# Patient Record
Sex: Male | Born: 1970 | Race: Black or African American | Hispanic: No | Marital: Single | Smoking: Current every day smoker
Health system: Southern US, Community
[De-identification: ages and names within clinical notes are randomized; demographics above are authoritative.]

## PROBLEM LIST (undated history)

## (undated) DIAGNOSIS — I10 Essential (primary) hypertension: Secondary | ICD-10-CM

## (undated) DIAGNOSIS — R200 Anesthesia of skin: Secondary | ICD-10-CM

## (undated) DIAGNOSIS — R21 Rash and other nonspecific skin eruption: Secondary | ICD-10-CM

## (undated) DIAGNOSIS — K219 Gastro-esophageal reflux disease without esophagitis: Secondary | ICD-10-CM

## (undated) DIAGNOSIS — G43909 Migraine, unspecified, not intractable, without status migrainosus: Secondary | ICD-10-CM

## (undated) DIAGNOSIS — F419 Anxiety disorder, unspecified: Secondary | ICD-10-CM

## (undated) DIAGNOSIS — F411 Generalized anxiety disorder: Secondary | ICD-10-CM

## (undated) HISTORY — DX: Essential (primary) hypertension: I10

## (undated) HISTORY — DX: Anesthesia of skin: R20.0

## (undated) HISTORY — DX: Gastro-esophageal reflux disease without esophagitis: K21.9

## (undated) HISTORY — DX: Anxiety disorder, unspecified: F41.9

## (undated) HISTORY — DX: Rash and other nonspecific skin eruption: R21

## (undated) HISTORY — DX: Generalized anxiety disorder: F41.1

## (undated) HISTORY — DX: Migraine, unspecified, not intractable, without status migrainosus: G43.909

## (undated) HISTORY — PX: APPENDECTOMY: SHX54

---

## 1997-11-23 ENCOUNTER — Emergency Department (HOSPITAL_COMMUNITY): Admission: EM | Admit: 1997-11-23 | Discharge: 1997-11-24 | Payer: Self-pay | Admitting: Emergency Medicine

## 2000-02-10 ENCOUNTER — Emergency Department (HOSPITAL_COMMUNITY): Admission: EM | Admit: 2000-02-10 | Discharge: 2000-02-10 | Payer: Self-pay | Admitting: *Deleted

## 2000-03-01 ENCOUNTER — Encounter: Payer: Self-pay | Admitting: Emergency Medicine

## 2000-03-01 ENCOUNTER — Emergency Department (HOSPITAL_COMMUNITY): Admission: EM | Admit: 2000-03-01 | Discharge: 2000-03-01 | Payer: Self-pay | Admitting: Emergency Medicine

## 2000-07-11 ENCOUNTER — Emergency Department (HOSPITAL_COMMUNITY): Admission: EM | Admit: 2000-07-11 | Discharge: 2000-07-11 | Payer: Self-pay | Admitting: Emergency Medicine

## 2000-08-28 ENCOUNTER — Emergency Department (HOSPITAL_COMMUNITY): Admission: EM | Admit: 2000-08-28 | Discharge: 2000-08-29 | Payer: Self-pay | Admitting: Emergency Medicine

## 2001-06-08 ENCOUNTER — Emergency Department (HOSPITAL_COMMUNITY): Admission: EM | Admit: 2001-06-08 | Discharge: 2001-06-08 | Payer: Self-pay | Admitting: Emergency Medicine

## 2001-06-08 ENCOUNTER — Encounter: Payer: Self-pay | Admitting: Emergency Medicine

## 2001-12-29 ENCOUNTER — Encounter: Payer: Self-pay | Admitting: Emergency Medicine

## 2001-12-29 ENCOUNTER — Emergency Department (HOSPITAL_COMMUNITY): Admission: EM | Admit: 2001-12-29 | Discharge: 2001-12-29 | Payer: Self-pay | Admitting: Emergency Medicine

## 2002-06-19 DIAGNOSIS — R200 Anesthesia of skin: Secondary | ICD-10-CM

## 2002-06-19 HISTORY — DX: Anesthesia of skin: R20.0

## 2002-06-23 ENCOUNTER — Encounter: Payer: Self-pay | Admitting: Emergency Medicine

## 2002-06-23 ENCOUNTER — Emergency Department (HOSPITAL_COMMUNITY): Admission: EM | Admit: 2002-06-23 | Discharge: 2002-06-23 | Payer: Self-pay | Admitting: Emergency Medicine

## 2002-07-19 ENCOUNTER — Encounter: Payer: Self-pay | Admitting: *Deleted

## 2002-07-19 ENCOUNTER — Emergency Department (HOSPITAL_COMMUNITY): Admission: EM | Admit: 2002-07-19 | Discharge: 2002-07-19 | Payer: Self-pay | Admitting: *Deleted

## 2002-07-21 ENCOUNTER — Observation Stay (HOSPITAL_COMMUNITY): Admission: EM | Admit: 2002-07-21 | Discharge: 2002-07-23 | Payer: Self-pay | Admitting: Emergency Medicine

## 2002-07-21 ENCOUNTER — Encounter: Payer: Self-pay | Admitting: Internal Medicine

## 2002-07-22 ENCOUNTER — Encounter: Payer: Self-pay | Admitting: Pediatrics

## 2002-07-22 ENCOUNTER — Encounter (INDEPENDENT_AMBULATORY_CARE_PROVIDER_SITE_OTHER): Payer: Self-pay | Admitting: *Deleted

## 2003-01-21 ENCOUNTER — Emergency Department (HOSPITAL_COMMUNITY): Admission: EM | Admit: 2003-01-21 | Discharge: 2003-01-22 | Payer: Self-pay | Admitting: Emergency Medicine

## 2003-01-22 ENCOUNTER — Encounter: Payer: Self-pay | Admitting: Emergency Medicine

## 2003-03-29 ENCOUNTER — Emergency Department (HOSPITAL_COMMUNITY): Admission: EM | Admit: 2003-03-29 | Discharge: 2003-03-29 | Payer: Self-pay | Admitting: Emergency Medicine

## 2003-05-08 ENCOUNTER — Emergency Department (HOSPITAL_COMMUNITY): Admission: EM | Admit: 2003-05-08 | Discharge: 2003-05-08 | Payer: Self-pay | Admitting: Emergency Medicine

## 2003-07-06 ENCOUNTER — Emergency Department (HOSPITAL_COMMUNITY): Admission: AD | Admit: 2003-07-06 | Discharge: 2003-07-06 | Payer: Self-pay | Admitting: Family Medicine

## 2003-07-27 ENCOUNTER — Emergency Department (HOSPITAL_COMMUNITY): Admission: EM | Admit: 2003-07-27 | Discharge: 2003-07-27 | Payer: Self-pay | Admitting: Family Medicine

## 2003-08-12 ENCOUNTER — Emergency Department (HOSPITAL_COMMUNITY): Admission: AD | Admit: 2003-08-12 | Discharge: 2003-08-12 | Payer: Self-pay | Admitting: Family Medicine

## 2004-01-24 ENCOUNTER — Emergency Department (HOSPITAL_COMMUNITY): Admission: EM | Admit: 2004-01-24 | Discharge: 2004-01-24 | Payer: Self-pay | Admitting: Family Medicine

## 2004-06-07 ENCOUNTER — Ambulatory Visit: Payer: Self-pay | Admitting: Internal Medicine

## 2004-06-28 ENCOUNTER — Ambulatory Visit: Payer: Self-pay | Admitting: Internal Medicine

## 2004-07-07 ENCOUNTER — Ambulatory Visit: Payer: Self-pay | Admitting: Internal Medicine

## 2004-08-11 ENCOUNTER — Ambulatory Visit: Payer: Self-pay | Admitting: Internal Medicine

## 2004-12-19 ENCOUNTER — Ambulatory Visit: Payer: Self-pay | Admitting: Family Medicine

## 2004-12-23 ENCOUNTER — Ambulatory Visit: Payer: Self-pay

## 2005-01-19 ENCOUNTER — Ambulatory Visit: Payer: Self-pay | Admitting: Internal Medicine

## 2005-02-17 ENCOUNTER — Ambulatory Visit: Payer: Self-pay | Admitting: Internal Medicine

## 2005-06-12 ENCOUNTER — Emergency Department (HOSPITAL_COMMUNITY): Admission: EM | Admit: 2005-06-12 | Discharge: 2005-06-13 | Payer: Self-pay | Admitting: Emergency Medicine

## 2005-07-13 ENCOUNTER — Ambulatory Visit: Payer: Self-pay | Admitting: Family Medicine

## 2005-07-14 ENCOUNTER — Encounter: Admission: RE | Admit: 2005-07-14 | Discharge: 2005-07-14 | Payer: Self-pay | Admitting: Family Medicine

## 2005-07-14 ENCOUNTER — Ambulatory Visit: Payer: Self-pay | Admitting: Family Medicine

## 2005-09-20 ENCOUNTER — Ambulatory Visit: Payer: Self-pay | Admitting: Family Medicine

## 2006-01-18 ENCOUNTER — Emergency Department (HOSPITAL_COMMUNITY): Admission: EM | Admit: 2006-01-18 | Discharge: 2006-01-18 | Payer: Self-pay | Admitting: Family Medicine

## 2006-01-18 ENCOUNTER — Emergency Department (HOSPITAL_COMMUNITY): Admission: EM | Admit: 2006-01-18 | Discharge: 2006-01-18 | Payer: Self-pay | Admitting: Emergency Medicine

## 2006-02-02 ENCOUNTER — Inpatient Hospital Stay (HOSPITAL_COMMUNITY): Admission: EM | Admit: 2006-02-02 | Discharge: 2006-02-07 | Payer: Self-pay | Admitting: Emergency Medicine

## 2006-02-06 ENCOUNTER — Ambulatory Visit: Payer: Self-pay | Admitting: Gastroenterology

## 2006-02-07 ENCOUNTER — Encounter (INDEPENDENT_AMBULATORY_CARE_PROVIDER_SITE_OTHER): Payer: Self-pay | Admitting: *Deleted

## 2006-02-11 ENCOUNTER — Emergency Department (HOSPITAL_COMMUNITY): Admission: EM | Admit: 2006-02-11 | Discharge: 2006-02-12 | Payer: Self-pay | Admitting: Emergency Medicine

## 2006-02-13 ENCOUNTER — Ambulatory Visit: Payer: Self-pay | Admitting: Internal Medicine

## 2006-03-06 ENCOUNTER — Encounter: Admission: RE | Admit: 2006-03-06 | Discharge: 2006-03-06 | Payer: Self-pay

## 2006-03-21 ENCOUNTER — Encounter (INDEPENDENT_AMBULATORY_CARE_PROVIDER_SITE_OTHER): Payer: Self-pay | Admitting: Specialist

## 2006-03-21 ENCOUNTER — Inpatient Hospital Stay (HOSPITAL_COMMUNITY): Admission: RE | Admit: 2006-03-21 | Discharge: 2006-03-26 | Payer: Self-pay

## 2006-05-09 ENCOUNTER — Emergency Department (HOSPITAL_COMMUNITY): Admission: EM | Admit: 2006-05-09 | Discharge: 2006-05-09 | Payer: Self-pay | Admitting: Emergency Medicine

## 2006-05-16 ENCOUNTER — Ambulatory Visit: Payer: Self-pay | Admitting: Internal Medicine

## 2006-05-16 LAB — CONVERTED CEMR LAB
BUN: 15 mg/dL (ref 6–23)
Potassium: 4.4 meq/L (ref 3.5–5.1)

## 2006-11-22 DIAGNOSIS — I10 Essential (primary) hypertension: Secondary | ICD-10-CM

## 2006-11-22 DIAGNOSIS — G43909 Migraine, unspecified, not intractable, without status migrainosus: Secondary | ICD-10-CM | POA: Insufficient documentation

## 2006-11-22 HISTORY — DX: Essential (primary) hypertension: I10

## 2006-11-22 HISTORY — DX: Migraine, unspecified, not intractable, without status migrainosus: G43.909

## 2006-11-26 ENCOUNTER — Encounter (INDEPENDENT_AMBULATORY_CARE_PROVIDER_SITE_OTHER): Payer: Self-pay | Admitting: *Deleted

## 2007-01-11 IMAGING — CT CT ABDOMEN W/ CM
2 of 4 series · 15 of 42 positions shown, 19 images · IV contrast (READY/WATER & [ID] OMNI 300)
Comparison: 02/02/06.

CLINICAL DATA: Crohn's disease.  Pelvic pain.  Follow up inflammatory mass after course of antibiotics.  
ABDOMEN CT WITH CONTRAST:
TECHNIQUE: Multidetector CT imaging of the abdomen was performed following the standard protocol during bolus administration of intravenous contrast.
Contrast:  400cc Omnipaque 300 and oral contrast.
TECHNIQUE: Multidetector CT imaging of the pelvis was performed following the standard protocol during bolus administration of intravenous contrast.

[Series 2: a&p w/ · axial · 0.62mm/px · z∈[-423,-48]mm · 12 of 87 slices shown, 16 images]
[im 8/87  soft-tissue]
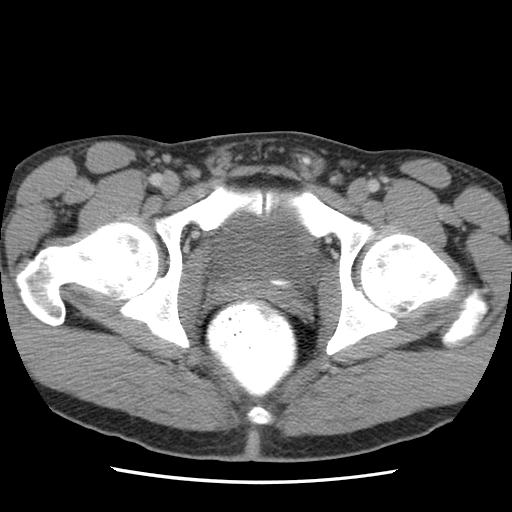
[im 8/87  bone]
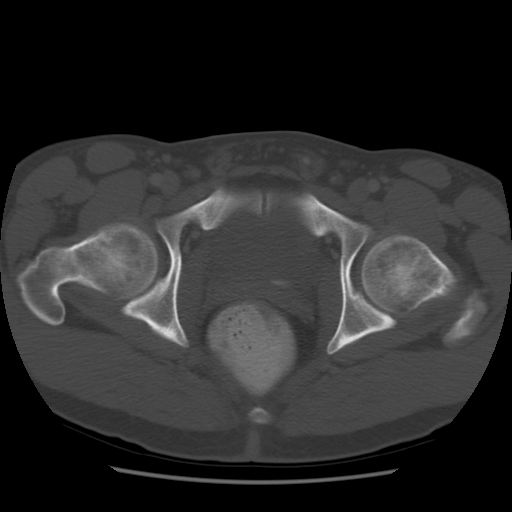
[im 15/87  soft-tissue]
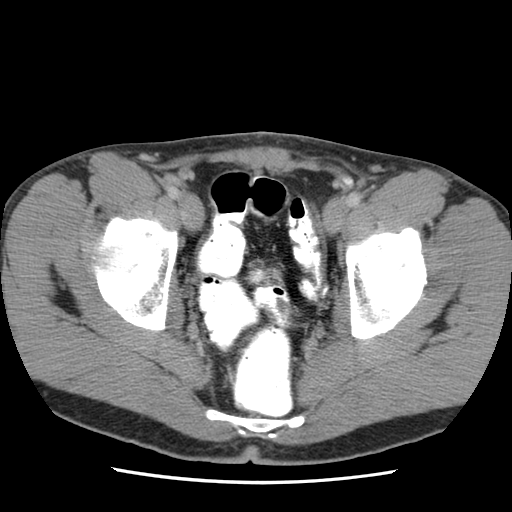
[im 23/87  soft-tissue]
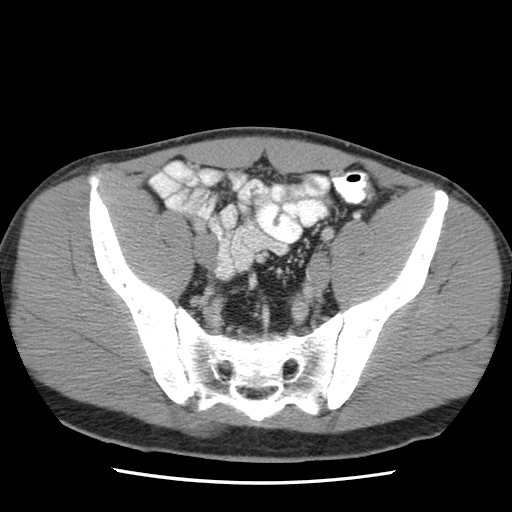
[im 30/87  soft-tissue]
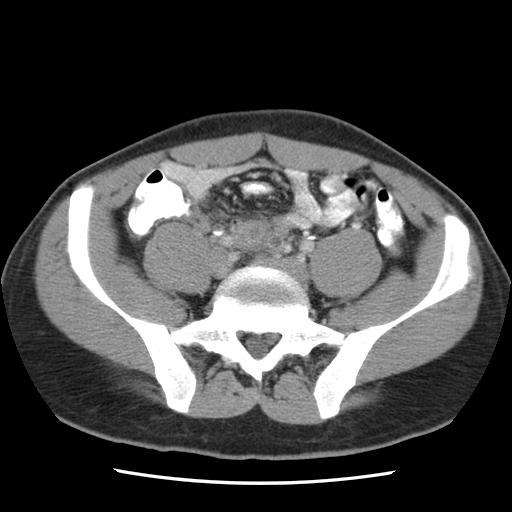
[im 38/87  soft-tissue]
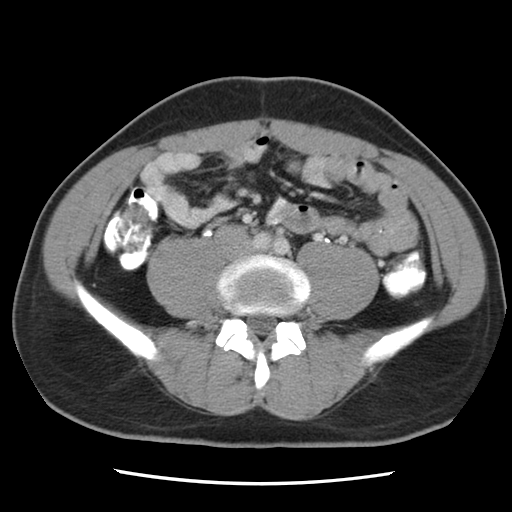
[im 49/87  soft-tissue]
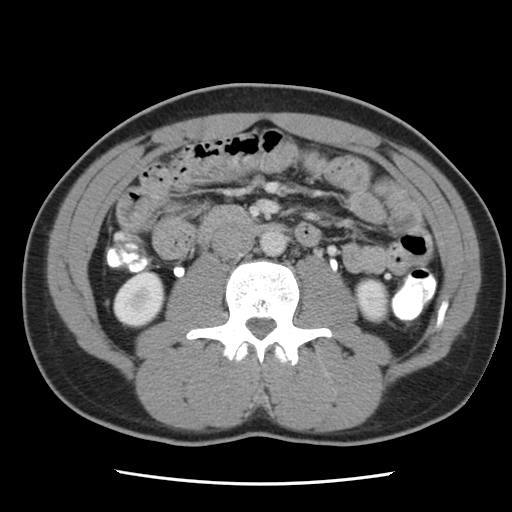
[im 57/87  soft-tissue]
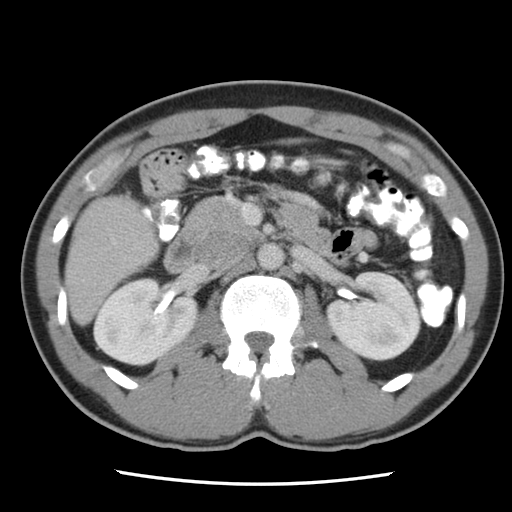
[im 64/87  soft-tissue]
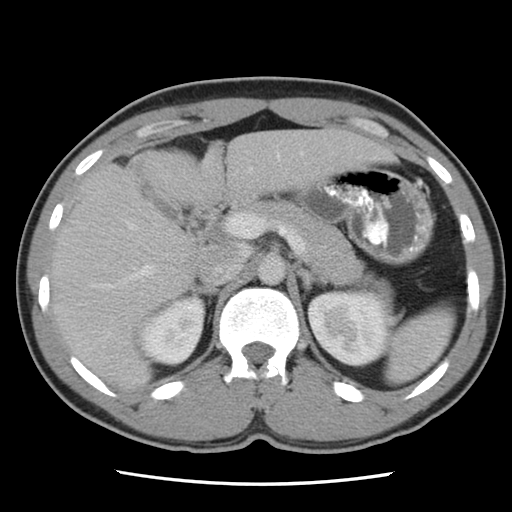
[im 72/87  soft-tissue]
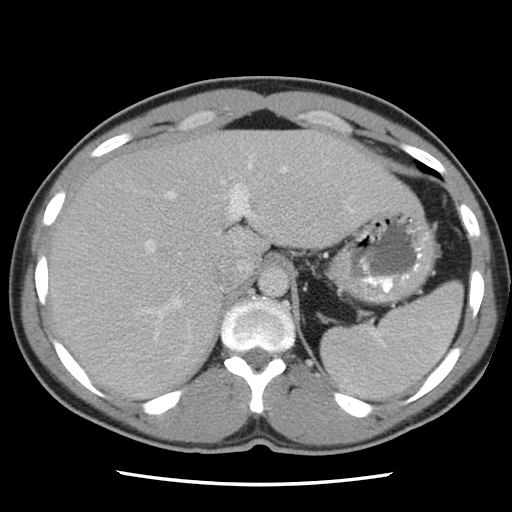
[im 72/87  lung]
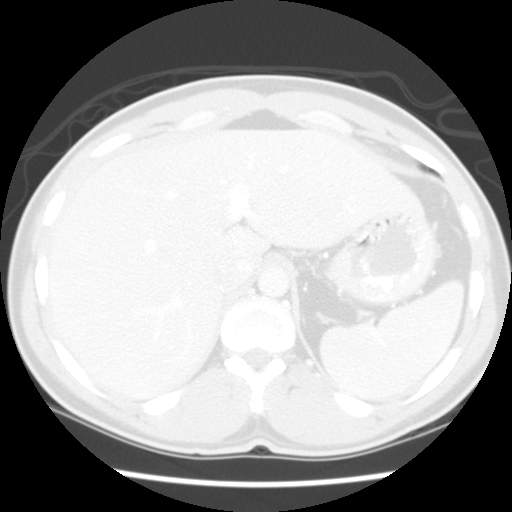
[im 72/87  bone]
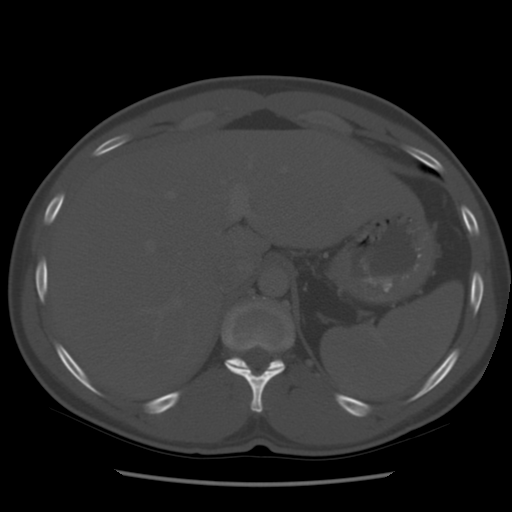
[im 75/87  lung]
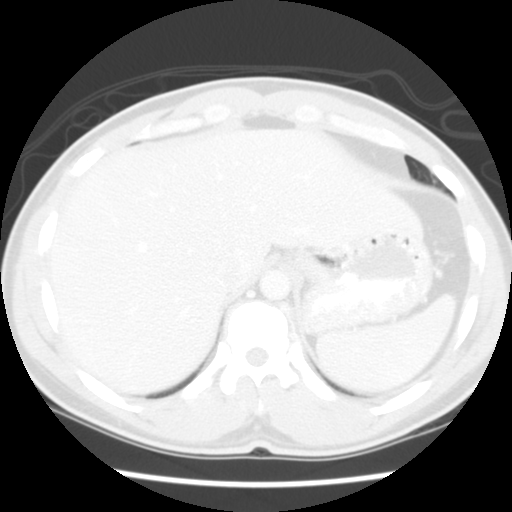
[im 79/87  soft-tissue]
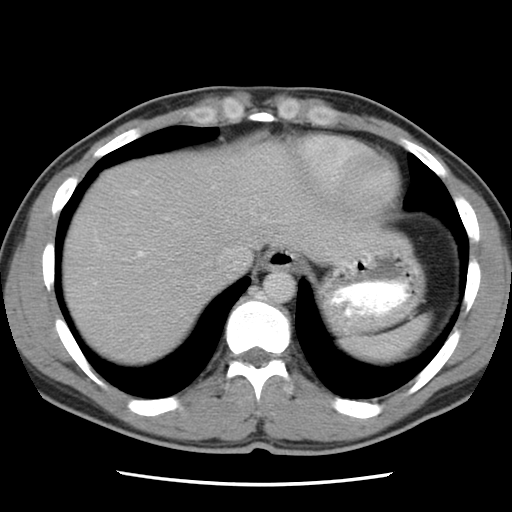
[im 79/87  lung]
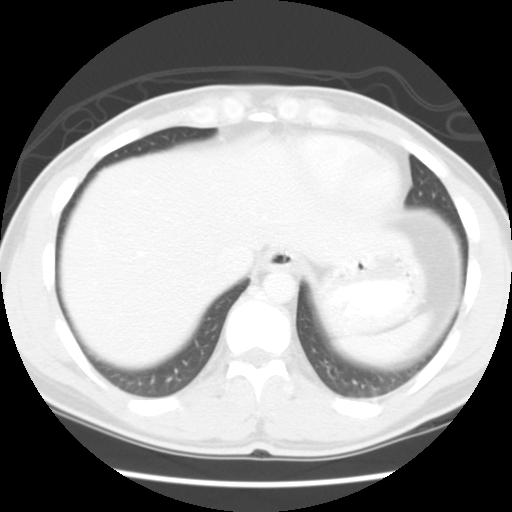
[im 83/87  lung]
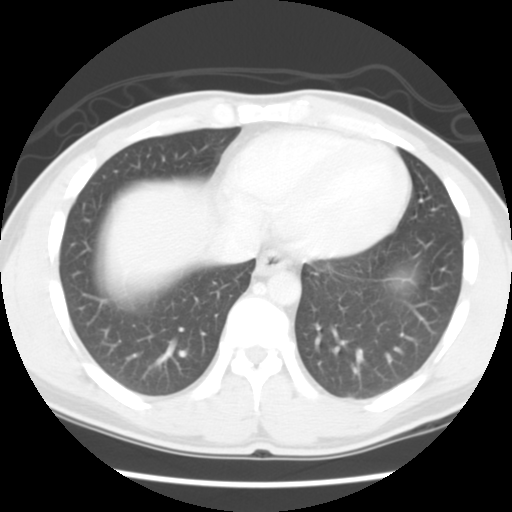

[Series 143: reformatted · sagittal · 0.85mm/px · 3 of 116 slices shown]
[im 24/116  soft-tissue]
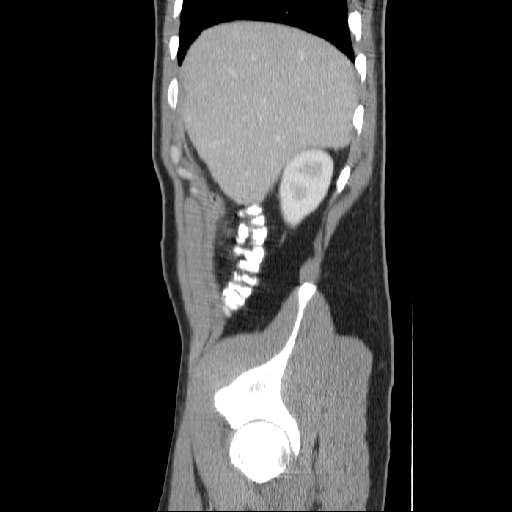
[im 47/116  soft-tissue]
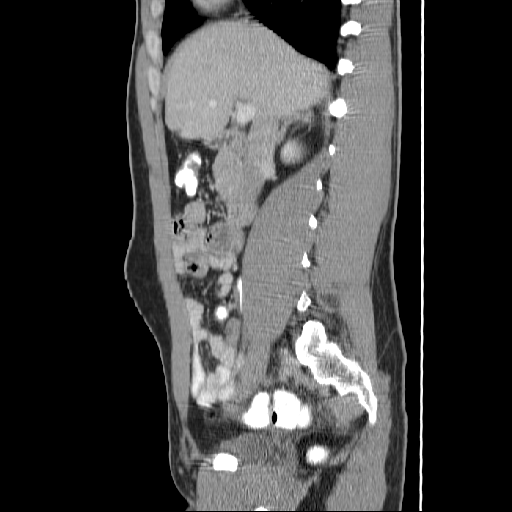
[im 70/116  soft-tissue]
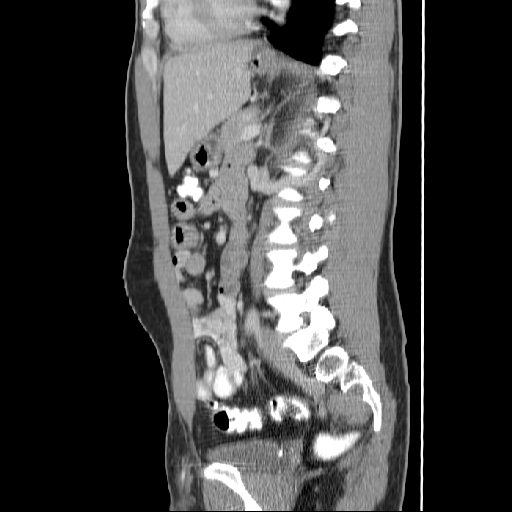

[15 of 42 positions shown; findings below may reference images not displayed]

FINDINGS: The abdominal parenchymal organs are normal in appearance.  There is no evidence of hydronephrosis.  Gallbladder is unremarkable.  There is no evidence of mass or inflammatory process.  No abnormal fluid collections are seen within the abdomen and there is no evidence of bowel wall thickening or dilatation.
IMPRESSION: Negative abdomen CT. 
PELVIS CT WITH CONTRAST:
FINDINGS: There has been significant decrease in size of inflammatory mass in the upper pelvis located between the common iliac arteries since prior study, as well as associated small bowel wall thickening.  More discrete fluid density areas are seen centrally within this inflammatory mass however there are no drainable fluid collections present.  Overall dimensions of the mass now measure 2.1 x 3.4cm which is decreased compared with 4.2 x 5.5cm on prior study.  No other inflammatory changes are seen and there is no evidence of free fluid or bowel wall thickening.
IMPRESSION: 1.  Significant interval decrease in size of inflammatory mass and small bowel wall thickening in the central pelvis. 
2.  No new or worsening findings since prior study.

## 2007-04-04 ENCOUNTER — Telehealth (INDEPENDENT_AMBULATORY_CARE_PROVIDER_SITE_OTHER): Payer: Self-pay | Admitting: *Deleted

## 2007-04-15 ENCOUNTER — Telehealth (INDEPENDENT_AMBULATORY_CARE_PROVIDER_SITE_OTHER): Payer: Self-pay | Admitting: *Deleted

## 2007-05-02 ENCOUNTER — Telehealth (INDEPENDENT_AMBULATORY_CARE_PROVIDER_SITE_OTHER): Payer: Self-pay | Admitting: *Deleted

## 2007-06-03 ENCOUNTER — Telehealth (INDEPENDENT_AMBULATORY_CARE_PROVIDER_SITE_OTHER): Payer: Self-pay | Admitting: *Deleted

## 2007-06-03 ENCOUNTER — Ambulatory Visit: Payer: Self-pay | Admitting: Internal Medicine

## 2007-06-03 DIAGNOSIS — F411 Generalized anxiety disorder: Secondary | ICD-10-CM | POA: Insufficient documentation

## 2007-06-03 DIAGNOSIS — L03319 Cellulitis of trunk, unspecified: Secondary | ICD-10-CM

## 2007-06-03 DIAGNOSIS — K219 Gastro-esophageal reflux disease without esophagitis: Secondary | ICD-10-CM

## 2007-06-03 DIAGNOSIS — L02219 Cutaneous abscess of trunk, unspecified: Secondary | ICD-10-CM

## 2007-06-03 HISTORY — DX: Generalized anxiety disorder: F41.1

## 2007-06-03 HISTORY — DX: Gastro-esophageal reflux disease without esophagitis: K21.9

## 2007-06-05 LAB — CONVERTED CEMR LAB
BUN: 15 mg/dL (ref 6–23)
Calcium: 9.5 mg/dL (ref 8.4–10.5)
Chloride: 102 meq/L (ref 96–112)
Eosinophils Absolute: 0.1 10*3/uL (ref 0.0–0.6)
GFR calc non Af Amer: 81 mL/min
Hemoglobin: 13.3 g/dL (ref 13.0–17.0)
MCHC: 33.4 g/dL (ref 30.0–36.0)
Neutro Abs: 2.6 10*3/uL (ref 1.4–7.7)
Potassium: 4.1 meq/L (ref 3.5–5.1)
WBC: 4.7 10*3/uL (ref 4.5–10.5)

## 2007-07-10 ENCOUNTER — Ambulatory Visit: Payer: Self-pay | Admitting: Internal Medicine

## 2007-09-30 ENCOUNTER — Ambulatory Visit: Payer: Self-pay | Admitting: Internal Medicine

## 2008-01-28 ENCOUNTER — Ambulatory Visit: Payer: Self-pay | Admitting: Internal Medicine

## 2008-01-28 DIAGNOSIS — R109 Unspecified abdominal pain: Secondary | ICD-10-CM | POA: Insufficient documentation

## 2008-02-14 ENCOUNTER — Telehealth (INDEPENDENT_AMBULATORY_CARE_PROVIDER_SITE_OTHER): Payer: Self-pay | Admitting: *Deleted

## 2008-02-18 ENCOUNTER — Ambulatory Visit: Payer: Self-pay | Admitting: Internal Medicine

## 2008-02-28 ENCOUNTER — Ambulatory Visit: Payer: Self-pay | Admitting: Internal Medicine

## 2008-03-05 LAB — CONVERTED CEMR LAB
BUN: 22 mg/dL (ref 6–23)
Chloride: 98 meq/L (ref 96–112)
GFR calc non Af Amer: 73 mL/min
Glucose, Bld: 102 mg/dL — ABNORMAL HIGH (ref 70–99)
Potassium: 3.8 meq/L (ref 3.5–5.1)
Sodium: 135 meq/L (ref 135–145)

## 2008-03-25 ENCOUNTER — Telehealth: Payer: Self-pay | Admitting: Internal Medicine

## 2008-04-17 ENCOUNTER — Telehealth (INDEPENDENT_AMBULATORY_CARE_PROVIDER_SITE_OTHER): Payer: Self-pay | Admitting: *Deleted

## 2008-04-27 ENCOUNTER — Encounter (INDEPENDENT_AMBULATORY_CARE_PROVIDER_SITE_OTHER): Payer: Self-pay | Admitting: *Deleted

## 2008-05-11 ENCOUNTER — Encounter (INDEPENDENT_AMBULATORY_CARE_PROVIDER_SITE_OTHER): Payer: Self-pay | Admitting: *Deleted

## 2008-05-11 ENCOUNTER — Ambulatory Visit: Payer: Self-pay | Admitting: Internal Medicine

## 2008-05-13 ENCOUNTER — Telehealth (INDEPENDENT_AMBULATORY_CARE_PROVIDER_SITE_OTHER): Payer: Self-pay | Admitting: *Deleted

## 2008-06-15 ENCOUNTER — Telehealth (INDEPENDENT_AMBULATORY_CARE_PROVIDER_SITE_OTHER): Payer: Self-pay | Admitting: *Deleted

## 2008-06-16 ENCOUNTER — Telehealth (INDEPENDENT_AMBULATORY_CARE_PROVIDER_SITE_OTHER): Payer: Self-pay | Admitting: *Deleted

## 2008-11-11 ENCOUNTER — Telehealth (INDEPENDENT_AMBULATORY_CARE_PROVIDER_SITE_OTHER): Payer: Self-pay | Admitting: *Deleted

## 2008-11-13 ENCOUNTER — Telehealth (INDEPENDENT_AMBULATORY_CARE_PROVIDER_SITE_OTHER): Payer: Self-pay | Admitting: *Deleted

## 2008-11-23 ENCOUNTER — Telehealth (INDEPENDENT_AMBULATORY_CARE_PROVIDER_SITE_OTHER): Payer: Self-pay | Admitting: *Deleted

## 2008-11-30 ENCOUNTER — Encounter: Payer: Self-pay | Admitting: Internal Medicine

## 2008-11-30 ENCOUNTER — Telehealth (INDEPENDENT_AMBULATORY_CARE_PROVIDER_SITE_OTHER): Payer: Self-pay | Admitting: *Deleted

## 2008-12-31 ENCOUNTER — Emergency Department (HOSPITAL_COMMUNITY): Admission: EM | Admit: 2008-12-31 | Discharge: 2008-12-31 | Payer: Self-pay | Admitting: Emergency Medicine

## 2009-02-03 ENCOUNTER — Encounter (INDEPENDENT_AMBULATORY_CARE_PROVIDER_SITE_OTHER): Payer: Self-pay | Admitting: *Deleted

## 2009-02-15 ENCOUNTER — Telehealth: Payer: Self-pay | Admitting: Internal Medicine

## 2009-03-01 ENCOUNTER — Ambulatory Visit: Payer: Self-pay | Admitting: Internal Medicine

## 2009-03-05 LAB — CONVERTED CEMR LAB
Creatinine, Ser: 1.2 mg/dL (ref 0.4–1.5)
Glucose, Bld: 124 mg/dL — ABNORMAL HIGH (ref 70–99)
Potassium: 3.7 meq/L (ref 3.5–5.1)
Sodium: 141 meq/L (ref 135–145)

## 2009-03-25 ENCOUNTER — Ambulatory Visit: Payer: Self-pay | Admitting: Internal Medicine

## 2009-03-25 LAB — CONVERTED CEMR LAB
Bilirubin Urine: NEGATIVE
Nitrite: NEGATIVE
Protein, U semiquant: NEGATIVE
Urobilinogen, UA: 0.2

## 2009-08-02 ENCOUNTER — Telehealth (INDEPENDENT_AMBULATORY_CARE_PROVIDER_SITE_OTHER): Payer: Self-pay | Admitting: *Deleted

## 2009-09-28 ENCOUNTER — Encounter (INDEPENDENT_AMBULATORY_CARE_PROVIDER_SITE_OTHER): Payer: Self-pay | Admitting: *Deleted

## 2009-10-15 ENCOUNTER — Telehealth (INDEPENDENT_AMBULATORY_CARE_PROVIDER_SITE_OTHER): Payer: Self-pay | Admitting: *Deleted

## 2009-10-25 ENCOUNTER — Telehealth (INDEPENDENT_AMBULATORY_CARE_PROVIDER_SITE_OTHER): Payer: Self-pay | Admitting: *Deleted

## 2009-12-22 ENCOUNTER — Ambulatory Visit: Payer: Self-pay | Admitting: Internal Medicine

## 2009-12-22 DIAGNOSIS — R7309 Other abnormal glucose: Secondary | ICD-10-CM | POA: Insufficient documentation

## 2009-12-23 LAB — CONVERTED CEMR LAB
Basophils Absolute: 0 10*3/uL (ref 0.0–0.1)
Calcium: 9.7 mg/dL (ref 8.4–10.5)
Eosinophils Absolute: 0.1 10*3/uL (ref 0.0–0.7)
GFR calc non Af Amer: 97.85 mL/min (ref >60)
HCT: 41.8 % (ref 39.0–52.0)
Hemoglobin: 13.9 g/dL (ref 13.0–17.0)
Hgb A1c MFr Bld: 6.4 % (ref 4.6–6.5)
Lymphocytes Relative: 27 % (ref 12.0–46.0)
Lymphs Abs: 1.3 10*3/uL (ref 0.7–4.0)
MCHC: 33.2 g/dL (ref 30.0–36.0)
MCV: 77.8 fL — ABNORMAL LOW (ref 78.0–100.0)
Monocytes Absolute: 0.5 10*3/uL (ref 0.1–1.0)
Neutro Abs: 3.1 10*3/uL (ref 1.4–7.7)
Potassium: 4.2 meq/L (ref 3.5–5.1)
RBC: 5.38 M/uL (ref 4.22–5.81)
RDW: 13.9 % (ref 11.5–14.6)
Sodium: 140 meq/L (ref 135–145)
WBC: 5 10*3/uL (ref 4.5–10.5)

## 2009-12-24 ENCOUNTER — Encounter: Payer: Self-pay | Admitting: Internal Medicine

## 2010-03-09 ENCOUNTER — Telehealth (INDEPENDENT_AMBULATORY_CARE_PROVIDER_SITE_OTHER): Payer: Self-pay | Admitting: *Deleted

## 2010-03-16 ENCOUNTER — Ambulatory Visit: Payer: Self-pay | Admitting: Family Medicine

## 2010-03-16 DIAGNOSIS — L089 Local infection of the skin and subcutaneous tissue, unspecified: Secondary | ICD-10-CM | POA: Insufficient documentation

## 2010-03-17 ENCOUNTER — Encounter: Payer: Self-pay | Admitting: Family Medicine

## 2010-03-21 ENCOUNTER — Telehealth (INDEPENDENT_AMBULATORY_CARE_PROVIDER_SITE_OTHER): Payer: Self-pay | Admitting: *Deleted

## 2010-03-28 ENCOUNTER — Encounter: Payer: Self-pay | Admitting: Family Medicine

## 2010-05-19 ENCOUNTER — Telehealth: Payer: Self-pay | Admitting: Internal Medicine

## 2010-06-07 ENCOUNTER — Telehealth: Payer: Self-pay | Admitting: Internal Medicine

## 2010-07-04 ENCOUNTER — Telehealth: Payer: Self-pay | Admitting: Internal Medicine

## 2010-07-19 NOTE — Letter (Signed)
Summary: Generic Letter  [facility] [place]  [address] [place], [place] [address]   Phone: [phone]  Fax: [phone]        03/28/2010   DEMARRI ELIE [address] [place], [place]  [address]  Dear Mr. Cathers,    Hello, I have been trying to contact you by telephone in regards to your last Office visit. Please give me a call back at [phone]. I will be available Mon-Friday 8am until 5pm. I look forward to hearing from you.       Sincerely, Loreen Freud DO/ Almeta Monas CMA AAMA)

## 2010-07-19 NOTE — Letter (Signed)
Summary: Primary Care Appointment Letter  [facility] [place]  [address] [place], [place] [address]   Phone: [phone]  Fax: [phone]    09/28/2009 MRN: [id number]  [name] [address] [place], [place]  [address]  Dear Mr. Kruer,   Your Primary Care Physician Aplin E. Paz MD has indicated that:    ___X____it is time to schedule an appointment.  Please call our office @ [phone] to schedule an office visit with Dr. Drue Novel.      Thank you,    [facility] Scheduler
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
## 2010-07-19 NOTE — Progress Notes (Signed)
Summary: 10-3--TRY LATER  10/4, 10/7  Phone Note Outgoing Call   Call placed by: Jeremy Johann CMA,  March 21, 2010 9:33 AM Details for Reason: + staph---it is not MRSA sensitive to doxy----finish abx and rto if it does not completely resolve Summary of Call: Tried to call pt phone not accepting call and vm not accepting messages. Will try again later............Marland KitchenFelecia Deloach CMA  March 21, 2010 9:33 AM   [place] calling cell phone and phone is not accepting call, tried the work number and gentleman who answered said he is no longer empoyeed there. Initial call taken by: Almeta Monas CMA Duncan Dull),  March 22, 2010 10:58 AM  Follow-up for Phone Call        Tried to notify the patient and received automated message that "mailbox is not accepting messages at this time". Lucious Groves CMA  March 25, 2010 3:22 PM   Additional Follow-up for Phone Call Additional follow up Details #1::        tried calling pt but the number is not a working number. Letter mailed to contact the office. Additional Follow-up by: Almeta Monas CMA Duncan Dull),  March 28, 2010 11:43 AM     Appended Document: 10-3--TRY LATER  10/4, 10/7 pt called back and is aware of results will finish med and RTO if no better.

## 2010-07-19 NOTE — Assessment & Plan Note (Signed)
Summary: RTO 6 MONTHS/CBS   Vital Signs:  Patient profile:   40 year old male Weight:      201 pounds BMI:     26.61 Pulse rate:   88 / minute Pulse rhythm:   regular BP sitting:   136 / 86  (left arm) Cuff size:   large  Vitals Entered By: Army Fossa CMA (December 22, 2009 9:22 AM) CC: follow up visit on BP. Comments needs refills   History of Present Illness: ROV feels well did not do labs as ordered at time of the last visit     palpitationsx 2 times in the setting of taking a lot of sodas  "heart was not going fast, I  just felt  my heart beat" symptoms better since he cut down on sodas no associated nausea or diaphoresis   Hypertension-- no ambulatory BPs , good medication compliance   GERD-- good medication compliance , asx     Allergies (verified): No Known Drug Allergies  Past History:  Past Medical History: Reviewed history from 06/03/2007 and no changes required. Hypertension migranie HAs GERD h/o facial numbness 2004: MRI-MRA ?small vessel dz;  (-) hyper-coag profile Anxiety  Past Surgical History: Reviewed history from 06/03/2007 and no changes required. 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 GI:  Denies diarrhea, nausea, and vomiting. Psych:  (+) stress .  Physical Exam  General:  alert and well-developed.   Lungs:  normal respiratory effort, no intercostal retractions, no accessory muscle use, and normal breath sounds.   Heart:  normal rate, regular rhythm, and no murmur.   Extremities:  no pretibial edema bilaterally  Psych:  Oriented X3, memory intact for recent and remote, normally interactive, good eye contact, not anxious appearing, and not depressed appearing.     Impression & Recommendations:  Problem # 1:   palpitations avoid caffeine call if symptoms came back  labs   Problem # 2:  HYPERGLYCEMIA (ICD-790.29) CBGs in the past aside elevated, check a hemoglobin A1c Orders: TLB-A1C / Hgb A1C (Glycohemoglobin) (83036-A1C)  Problem # 3:  HYPERTENSION (ICD-401.9) seems well controlled His updated medication list for this problem includes:    Calan Sr 240 Mg Cr-tabs (Verapamil hcl) .Marland Kitchen... 1 by mouth once daily -    Micardis 80 Mg Tabs (Telmisartan) .Marland Kitchen... 1 by mouth once daily    Hydrochlorothiazide 25 Mg Tabs (Hydrochlorothiazide) .Marland Kitchen... 1 by mouth once daily  Orders: Venipuncture ([address]) TLB-BMP (Basic Metabolic Panel-BMET) (80048-METABOL) TLB-CBC Platelet - w/Differential (85025-CBCD)  BP today: 136/86 Prior BP: 128/80 (03/25/2009)  Labs Reviewed: K+: 3.7 (03/01/2009) Creat: : 1.2 (03/01/2009)     Problem # 4:  GERD (ICD-530.81) rf His updated medication list for this problem includes:    Aciphex 20 Mg Tbec (Rabeprazole sodium) .Marland Kitchen... 1 by mouth once daily  Problem # 5:  HEALTH SCREENING (ICD-V70.0) patient is not fasting today, I will go ahead and check his blood today and ask him to come back fasting and will  check his cholesterol at the next office visit Orders: TLB-TSH (Thyroid Stimulating Hormone) (84443-TSH) TLB-ALT (SGPT) (84460-ALT) TLB-AST (SGOT) (84450-SGOT) TLB-TSH (Thyroid Stimulating Hormone) (84443-TSH)  Complete Medication List: 1)  Calan Sr 240 Mg Cr-tabs (Verapamil hcl) .Marland Kitchen.. 1 by mouth once daily - 2)  Micardis 80 Mg Tabs (Telmisartan) .Marland KitchenMarland KitchenMarland Kitchen  1 by mouth once daily 3)  Hydrochlorothiazide 25 Mg Tabs (Hydrochlorothiazide) .Marland Kitchen.. 1 by mouth once daily 4)  Aciphex 20 Mg Tbec (Rabeprazole sodium) .Marland Kitchen.. 1 by mouth once daily  Patient Instructions: 1)  Please schedule a follow-up appointment in 4 months .FASTING Prescriptions: ACIPHEX 20 MG TBEC (RABEPRAZOLE SODIUM) 1 by mouth once daily  #90 x 3   Entered and Authorized by:   Elita Quick E. Kati Riggenbach MD   Signed by:   Nolon Rod. Mills Mitton  MD on 12/22/2009   Method used:   Print then Give to Patient   RxID:   [id number] HYDROCHLOROTHIAZIDE 25 MG TABS (HYDROCHLOROTHIAZIDE) 1 by mouth once daily  #90 x 3   Entered and Authorized by:   Nolon Rod. Miloh Alcocer MD   Signed by:   Nolon Rod. Kimberely Mccannon MD on 12/22/2009   Method used:   Print then Give to Patient   RxID:   [id number] MICARDIS 80 MG TABS (TELMISARTAN) 1 by mouth once daily  #90 x 3   Entered and Authorized by:   Nolon Rod. Kylieann Eagles MD   Signed by:   Nolon Rod. Sonam Wandel MD on 12/22/2009   Method used:   Print then Give to Patient   RxID:   [id number] CALAN SR 240 MG  CR-TABS (VERAPAMIL HCL) 1 by mouth once daily -  #90 x 3   Entered and Authorized by:   Nolon Rod. Alondria Mousseau MD   Signed by:   Nolon Rod. Radley Barto MD on 12/22/2009   Method used:   Print then Give to Patient   RxID:   [id number]

## 2010-07-19 NOTE — Progress Notes (Signed)
Summary: phone  Phone Note Call from Patient Call back at [phone]   Caller: Patient Summary of Call: pt called has no insurance and will not have any until 30 days. Aciphex rx cost $250, and Micardis expensive.  --Would like samples of Aciphex and Micardis, informed pt due for 6 mos followup, scheduled pt ov 12/01/09 should have insurance by then. Samples up front pt informed .Kandice Hams  Oct 25, 2009 11:09 AM   Initial call taken by: Kandice Hams,  Oct 25, 2009 11:09 AM

## 2010-07-19 NOTE — Progress Notes (Signed)
Summary: Samples--Micardis/Aciphex  Phone Note Refill Request Message from:  Patient on May 19, 2010 10:59 AM  Refills Requested: Medication #1:  MICARDIS 80 MG TABS 1 by mouth once daily   Notes: [phone]  Medication #2:  ACIPHEX 20 MG TBEC 1 by mouth once daily. SAMPLES NEEDED. Patient is aware that we have micardis samples up front for him and since we do not have samples of Aciphex we put Aciphex savings card up front also.  Initial call taken by: Lucious Groves CMA,  May 19, 2010 10:59 AM    Prescriptions: MICARDIS 80 MG TABS (TELMISARTAN) 1 by mouth once daily  #14 x 0   Entered by:   Lucious Groves CMA   Authorized by:   Nolon Rod. Paz MD   Signed by:   Lucious Groves CMA on 05/19/2010   Method used:   Samples Given   RxID:   [id number]

## 2010-07-19 NOTE — Progress Notes (Signed)
Summary: RX  Phone Note Call from Patient Call back at [phone]   Caller: Patient Summary of Call: PT IS CALLING FOR SAMPLE OF MICARDIS 80 MG OR 40 MG. PLEASE CALL IF WE HAVE SAMPLE Initial call taken by: Freddy Jaksch,  March 09, 2010 10:47 AM  Follow-up for Phone Call        Gave pt 3 weeks worth of 80mg   samples.  Follow-up by: Army Fossa CMA,  March 09, 2010 10:50 AM    Prescriptions: MICARDIS 80 MG TABS (TELMISARTAN) 1 by mouth once daily  #21 x 0   Entered and Authorized by:   Army Fossa CMA   Signed by:   Army Fossa CMA on 03/09/2010   Method used:   Samples Given   RxID:   [id number]

## 2010-07-19 NOTE — Progress Notes (Signed)
  Phone Note Call from Patient   Caller: Patient Summary of Call: Pt called left msg (very hard to hear, bad connection), called and spoke with pt wife who say pt is in [place], having tingling sensation  all over wanted to know ok to take Benadryl.  Recommend pt to nearest Urgent Care there for assessment, followup  with Dr Drue Novel as needed when back in town, wife agreed and will  inform pt Initial call taken by: Kandice Hams,  August 02, 2009 9:57 AM

## 2010-07-19 NOTE — Letter (Signed)
Summary: Generic Letter  [facility] [place]  [address] [place], [place] [address]   Phone: [phone]  Fax: [phone]    12/24/2009    [name] [address] [place], [place]  [address]   Dear [name], We have been unable to reach you by telephone and have some important medical information to share with you.  Please call our office at    [phone] Monday through Friday from 8am to 5pm.  Sincerely,    Mervin Kung CMA (AAMA)

## 2010-07-19 NOTE — Progress Notes (Signed)
Summary: ? insurance  Phone Note Call from Patient Call back at [phone]   Caller: Patient Summary of Call: pt left VM that he had a question about insurance....Marland KitchenMarland KitchenFelecia Deloach CMA  October 15, 2009 10:46 AM   pt states that he and his wife currently separated and he was dropped from her policy. Pt would like to know if we take cobra insurance. Spoke to Maralyn Sago who explain cobra is not a name of insurance it is a term used when a person no longer employed with employer or company and they still allow pt to carry insurance with them. Advise pt of sarah explanation, pt ok.............Marland KitchenFelecia Deloach CMA  October 15, 2009 10:56 AM  Initial call taken by: Jeremy Johann CMA,  October 15, 2009 10:46 AM

## 2010-07-21 NOTE — Progress Notes (Signed)
Summary: Samples  Phone Note Call from Patient Call back at Home Phone [phone]   Summary of Call: Patient left message on triage requesting samples of Micardis, but before he hung up he said Verapamil. Called to clarify, left message on voicemail to call back to office.  Initial call taken by: Lucious Groves CMA,  June 07, 2010 9:47 AM  Follow-up for Phone Call        Patient notified that refills were sent and #7 placed up front for pick up. He will call back to schedule fasting appt. Lucious Groves CMA  June 07, 2010 10:25 AM

## 2010-07-21 NOTE — Progress Notes (Signed)
Summary: CALL-A-NURSE  Phone Note Outgoing Call   Details for Reason: [facility] Triage Call Report Triage Record Num: [id number] Operator: Lesli Albee Patient Name: [name] Call Date & Time: 07/03/2010 9:06:54AM Patient Phone: [phone] PCP: Patient Gender: Male PCP Fax : Patient DOB: 03/26/1971 Practice Name: [facility] Reason for Call: Pt is out of his Verapamil which he takes for his BP. Pt just got back from [place] last night and gets his refills from [facility] which is closed today. There is a [facility] across the street - he wants the nurse to call in 6 days worth because he leaves early in the am for 5 days. Pt states he can pick up a refill from [facility] when he gets home From his prescription bottle - pt takes Verapil 240mg  QD. RN only able to call in 2 pills per policy. RN called MD on call/Dr. Cathey Endow who authorized 6 tablets. RN called this into [facility] [phone]. Protocol(s) Used: Medication Question Calls, No Triage (Adults) Recommended Outcome per Protocol: Call Provider within 24 Hours Reason for Outcome: Caller has non urgent medication question about med that PCP prescribed and triager unable to answer question Care Advice:  ~ Summary of Call: Patient continues to state Verapamil despite that I have discussed with him that he is not on Verapamil. The patient is on Micardis. I am not sure which med was called in for the patient. Patient is also due for appt. Please advise. Lucious Groves CMA  July 04, 2010 9:30 AM   Follow-up for Phone Call        he is supposed to be on: CALAN  which is VERAPAMIL , MICARDIS, HYDROCHLOROTHIAZIDE and ACIPHEX   call  1 month and 1 Rf. no further RF w/o OV Jose E. Paz MD  July 04, 2010 3:08 PM   Additional Follow-up for Phone Call Additional follow up Details #1::        Left message on voicemail to call back to office. Lucious Groves CMA  July 04, 2010 3:21 PM   No return call, I called and spoke  with the patient. He notes that they did get him the med he needed. Patient was advised of the above and will call back for office visit. Lucious Groves CMA  July 06, 2010 10:55 AM     Prescriptions: ACIPHEX 20 MG TBEC (RABEPRAZOLE SODIUM) 1 by mouth once daily  #30 x 0   Entered by:   Lucious Groves CMA   Authorized by:   Nolon Rod. Paz MD   Signed by:   Lucious Groves CMA on 07/04/2010   Method used:   Electronically to        [id number] (retail)       [address]       [place], [place]  [address]       Ph: [phone]       Fax: [phone]   RxID:   [id number] HYDROCHLOROTHIAZIDE 25 MG TABS (HYDROCHLOROTHIAZIDE) 1 by mouth once daily  #30 x 0   Entered by:   Lucious Groves CMA   Authorized by:   Nolon Rod. Paz MD   Signed by:   Lucious Groves CMA on 07/04/2010   Method used:   Electronically to        [id number] (retail)       [address]       [place]  [place], [place]  [address]       Ph: [phone]       Fax: [phone]   RxID:   [id number] MICARDIS 80 MG TABS (TELMISARTAN) 1 by mouth once daily  #30 x 0   Entered by:   Lucious Groves CMA   Authorized by:   Nolon Rod. Paz MD   Signed by:   Lucious Groves CMA on 07/04/2010   Method used:   Electronically to        [id number] (retail)       [address]       [place], [place]  [address]       Ph: [phone]       Fax: [phone]   RxID:   [id number] CALAN SR 240 MG  CR-TABS (VERAPAMIL HCL) 1 by mouth once daily -  #30 x 0   Entered by:   Lucious Groves CMA   Authorized by:   Nolon Rod. Paz MD   Signed by:   Lucious Groves CMA on 07/04/2010   Method used:   Electronically to        [id number] (retail)       [address]       [place], [place]  [address]       Ph: [phone]       Fax: [phone]   RxID:   [id number]

## 2010-08-09 ENCOUNTER — Telehealth: Payer: Self-pay | Admitting: Internal Medicine

## 2010-08-10 ENCOUNTER — Encounter: Payer: Self-pay | Admitting: Internal Medicine

## 2010-08-16 NOTE — Progress Notes (Signed)
Summary: Samples request  Phone Note Call from Patient Call back at Home Phone [phone]   Summary of Call: Patient called for samples of Micardis and Aciphex. He notes that he has changed jobs and does not have insurance at this time, and he is aware that he is overdue for appt. Patient is aware that this requires MD permission due to appt status. Patient expessed understanding. Please advise. Initial call taken by: Lucious Groves CMA,  August 09, 2010 11:09 AM  Follow-up for Phone Call        ok 2 month supply samples if available Dillin Lofgren E. Theus Espin MD  August 09, 2010 1:41 PM   Additional Follow-up for Phone Call Additional follow up Details #1::        Gave pt 2 months of Micardis, we do not have samples of Aciphex. Pt is aware.  Army Fossa CMA  August 09, 2010 1:46 PM     Prescriptions: MICARDIS 80 MG TABS (TELMISARTAN) 1 by mouth once daily  #2 month x 0   Entered by:   Army Fossa CMA   Authorized by:   Nolon Rod. Danaria Larsen MD   Signed by:   Army Fossa CMA on 08/09/2010   Method used:   Samples Given   RxID:   [id number]

## 2010-09-07 ENCOUNTER — Ambulatory Visit: Payer: Self-pay | Admitting: Internal Medicine

## 2010-09-07 DIAGNOSIS — Z0289 Encounter for other administrative examinations: Secondary | ICD-10-CM

## 2010-09-08 NOTE — Progress Notes (Unsigned)
  Subjective:    Patient ID: [name], male    DOB: [birth year], 40 y.o.   MRN: [id number]  HPI    Review of Systems     Objective:   Physical Exam        Assessment & Plan:

## 2010-09-25 LAB — POCT I-STAT, CHEM 8
BUN: 18 mg/dL (ref 6–23)
Calcium, Ion: 1.11 mmol/L — ABNORMAL LOW (ref 1.12–1.32)
Glucose, Bld: 93 mg/dL (ref 70–99)
HCT: 42 % (ref 39.0–52.0)
Hemoglobin: 14.3 g/dL (ref 13.0–17.0)
TCO2: 25 mmol/L (ref 0–100)

## 2010-10-18 ENCOUNTER — Encounter: Payer: Self-pay | Admitting: Internal Medicine

## 2010-10-18 ENCOUNTER — Ambulatory Visit (INDEPENDENT_AMBULATORY_CARE_PROVIDER_SITE_OTHER): Payer: 59 | Admitting: Internal Medicine

## 2010-10-18 DIAGNOSIS — Z113 Encounter for screening for infections with a predominantly sexual mode of transmission: Secondary | ICD-10-CM

## 2010-10-18 DIAGNOSIS — R21 Rash and other nonspecific skin eruption: Secondary | ICD-10-CM | POA: Insufficient documentation

## 2010-10-18 HISTORY — DX: Rash and other nonspecific skin eruption: R21

## 2010-10-18 MED ORDER — CLOTRIMAZOLE-BETAMETHASONE 1-0.05 % EX CREA: TOPICAL_CREAM | CUTANEOUS | Status: DC

## 2010-10-18 MED ORDER — DOXYCYCLINE HYCLATE 100 MG PO TABS: 100.0000 mg | ORAL_TABLET | Freq: Two times a day (BID) | ORAL | Status: AC
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
## 2010-10-18 NOTE — Patient Instructions (Signed)
Use the antibiotics and cream as prescribed

## 2010-10-18 NOTE — Assessment & Plan Note (Signed)
Inner leg rash, folliculitis? Fungal?. We'll treat with doxycycline and a cream. See prescriptions. Is extremely unlikely that this rash has anything to do with herpes. Patient aware. Nevertheless we will check HIV and VDRL as he was in contact with a person with herpes 3 months ago.

## 2010-10-19 LAB — HIV ANTIBODY (ROUTINE TESTING W REFLEX): HIV: NONREACTIVE

## 2010-10-20 ENCOUNTER — Telehealth: Payer: Self-pay | Admitting: *Deleted

## 2010-10-20 NOTE — Telephone Encounter (Signed)
Pt aware of information.  

## 2010-10-20 NOTE — Telephone Encounter (Signed)
Message copied by Army Fossa on Thu Oct 20, 2010 11:35 AM ------      Message from: Willow Ora      Created: Thu Oct 20, 2010 11:07 AM       Advise patient:      Results neg, plan is the same

## 2010-10-20 NOTE — Telephone Encounter (Signed)
Message left for patient to return my call.  

## 2010-11-02 ENCOUNTER — Other Ambulatory Visit: Payer: Self-pay | Admitting: Internal Medicine

## 2010-11-04 NOTE — Discharge Summary (Signed)
NAMECLEMENS, [name] NO.:  [id number]   MEDICAL RECORD NO.:  [id number]          PATIENT TYPE:  INP   LOCATION:  1619                         FACILITY:  [facility]   PHYSICIAN:  Lebron Conners, M.D.   DATE OF BIRTH:  03-Feb-1971   DATE OF ADMISSION:  03/21/2006  DATE OF DISCHARGE:  03/26/2006                                 DISCHARGE SUMMARY   HISTORY:  Mr. Smeltz is a 40 year old black male who was hospitalized at  [facility] from February 02, 2006 to February 07, 2006 because of  abdominal pain, fever and an abdominal inflammatory process thought to be  Crohn's disease.  He had subsequently undergone treatment with antibiotics  and had a colonoscopy which failed to demonstrate any abnormality of the  mucosa of the colon or ileum.  Follow-up CT scan had demonstrated a  persistent inflammatory mass in the region of the ileum.  The patient was  brought into the hospital for treatment of this with exploratory laparotomy  planned.   PAST MEDICAL HISTORY:  Excellent health.  No real problems or operations  except as mentioned above.   HOSPITAL COURSE:  On the day of admission the patient underwent exploration  of the abdomen.  The findings were a lot of adhesions.  There appeared to be  chronic ruptured appendix with inflammatory mass.  He underwent biopsy of  retroperitoneal mass and appendectomy.  The pathology confirmed chronic  perforated appendicitis and an inflammatory mass with no evidence of  malignancy.  He progressed slowly.  There was urinary retention at first and  that got better.  By March 26, 2006 he was stable, comfortable and bowels  had moved.  The abdomen was soft and wound was healing well.  Staples were  removed and he was asked to return to the office in 2 weeks.   DISCHARGE DIAGNOSIS:  Perforated appendicitis with inflammatory mass.   OPERATION:  Lysis of adhesions, biopsy of mass, appendectomy.   DISCHARGE CONDITION:  Improved.      Lebron Conners, M.D.  Electronically Signed     WB/MEDQ  D:  04/10/2006  T:  04/11/2006  Job:  [id number]

## 2010-11-04 NOTE — Consult Note (Signed)
NAME:  [name], [name]                         ACCOUNT NO.:  [id number]   MEDICAL RECORD NO.:  [id number]                   PATIENT TYPE:  INP   LOCATION:  0340                                 FACILITY:  [facility]   PHYSICIAN:  Deanna Artis. Sharene Skeans, M.D.           DATE OF BIRTH:  Oct 13, 1970   DATE OF CONSULTATION:  07/21/2002  DATE OF DISCHARGE:                                   CONSULTATION   CHIEF COMPLAINT:  Left facial numbness, left body numbness.   HISTORY OF PRESENT CONDITION:  The patient is a 40 year old right-handed,  married father who works as an International aid/development worker at [employer].  The patient  claims to have had extreme stress on his job for the past eight months.  He  says that much of it stems from the relationship he has with his Production designer, theatre/television/film.   The patient had onset Saturday of numbness and pain over the malar eminence  of the left side of his face.  He felt as if he was swallowing his tongue  and had difficulty handling his secretions.  He also complained of left-  sided numbness and felt as if he had difficulty bearing weight on his legs.  The patient was seen and evaluated at the [facility] Emergency Room with a  normal CT scan and laboratory and was sent home.   Symptoms occurred again today, and the patient this time complained of  severe pain that was migrainous in nature around and above the left eye.  This was pounding, very severe, associated with sensitivity to light and to  a lesser extent sound, also to movement.  The patient was nauseated but did  not vomit.  He was seen in the offices of Dr. Alwyn Ren, and plans were made to  set up an MRI scan and MRA to look for the presence of stroke.  He was also  given an aspirin a day and placed on Neurontin 100 mg three times a day with  the notion that this might be some form of atypical face pain.   When the patient's symptoms exacerbated, the decision was made to admit him  to the hospital.  I was asked to see him to  evaluate for the presence of  stroke/TIA versus some other process.   PAST MEDICAL HISTORY:  The patient has had one other migrainous headache a  month ago.  He has not had any closed head injuries, any nervous system  infections.   REVIEW OF SYSTEMS:  CONSTITUTIONAL:  No fever or loss of appetite or weight  loss or night sweats.  HEENT, NECK:  The patient complained of nasal  drainage but no cough, sore throat, or ear pain.  CARDIOVASCULAR:  No chest  pain or palpitations.  The patient has complained of shortness of breath and  has felt as if he cannot get his breath.  He came into the emergency room  breathing at 40  per minute and was noted by his manager to be  hyperventilating.  The patient had developed some pedal spasm on the right  leg and had tingling around his mouth and the tips of his fingers and in  both legs.  GASTROINTESTINAL/GENITOURINARY:  The patient has had nausea,  without vomiting.  No diarrhea.  No significant abdominal pain.  No bleeding  dyscrasias.  SKIN:  No rash.  MUSCULOSKELETAL:  No fractures or joint  disorder.  ALLERGY/IMMUNOLOGY:  No environmental allergies, immune or  autoimmune disorders.  REPRODUCTIVE:  Normal postpuberal male.  PSYCHIATRIC:  The patient has not had known problems with depression, anxiety, or panic  disorder in the past.  NEUROLOGIC:  Symptoms are as noted above.  The  patient has not had any fevers, any syncope.  He complains of having  dysarthria and had difficulty getting his words out.  This is not a problem  at this time.  Review of systems is otherwise negative.   PAST MEDICAL HISTORY:  Negative.   PAST SURGICAL HISTORY:  Negative.   CURRENT MEDICATIONS:  Aspirin and Neurontin 100 mg three times a day.  He  has not yet started the medicine.   DRUG ALLERGIES:  None known.   FAMILY HISTORY:  The patient's parents are alive and in their 41s.  Father  has hypertension and diabetes.  Mother has hypertension.  Paternal   grandmother is the only living grandparent, and she has hypertension and  diabetes like her son.  The patient's paternal grandfather died of heart  disease.  The maternal grandparents had cancer and died in their 55s.   TOBACCO/ALCOHOL:  The patient smokes a pack of cigarettes per week.  Two  beers every other day.  He lives with his wife and son.  They have been  married for a couple of years.  The boy is at least nine years of age.  The  patient is a Buyer, retail of [employer].  He has been working at [employer]  for about seven years.   By history in the emergency room the patient was seen at [facility] with  panic attacks four months ago and in July 2003 had a panic attack at [facility].  This is not information that he volunteered to me.   PHYSICAL EXAMINATION:  GENERAL:  Pleasant, right-handed gentleman in no  acute distress.  VITAL SIGNS:  Blood pressure 155/90, resting pulse 91, respirations 40, now  20, temperature 98, pulse oximetry 100% on room air.  HEENT:  Ears, nose, and throat:  No signs of infection.  NECK:  Supple.  Full range of motion.  No cranial or cervical bruits.  LUNGS:  Clear.  HEART:  No murmurs.  Pulses normal.  ABDOMEN:  Soft.  Bowel sounds normal.  No hepatosplenomegaly.  EXTREMITIES:  Normal.  NEUROLOGIC:  Mental status:  The patient is awake, alert, attentive.  No  dysphasia or dyspraxia.  He has normal fund of knowledge and memory.  Cranial nerves:  Round, reactive pupils.  Normal fundi.  Full visual fields  to double simultaneous stimuli.  Extraocular movements full and conjugate.  Okay in response to sequels.  Symmetric facial strength.  Midline tongue and  uvula.  Air conduction greater than bone conduction bilaterally.  Motor  examination:  Normal strength, tone, and mass.  Good fine motor movements.  No pronator drift.  Sensation intact to cold, vibration, stereognosis. Cerebellar examination:  Good finger-to-nose, rapid alternating movements.   Gait and station  normal.  He was able to walk on his heels and toes and  perform in tandem without difficulty.  Reflexes are symmetric and normal.  The patient had bilateral flexor plantar responses.   IMPRESSION:  1. 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  2. Numbness left face and body, 782.0.  This is of unknown etiology.  3. Hyperventilation syndrome which almost certainly was present today with     associated rapid respirations, numbness and tingling, and pedal spasm on     the right.  4. Hypertension without congestive heart failure, 404.10.   COMMENT:  I cannot rule out a TIA-like event, although I doubt this is the  case.  Migraines have been few and far between and do not require specific  treatment at present.  He needs a workup for stroke in the young, although I  suspect that this is a functional problem and related to stress.  His wife  asked about this, and I was honest with her.  I told her that we needed to  evaluate the possibility of stroke and then, if that proved not to be the  case, we could move on to consider other etiologies.   I appreciate the opportunity to see him.  If you have questions or I can be  of assistance, do not hesitate to contact me.                                               Deanna Artis. Sharene Skeans, M.D.    [facility]  D:  07/21/2002  T:  07/21/2002  Job:  [id number]   cc:   Titus Dubin. Alwyn Ren, M.D. [facility]   Wanda Plump, MD [facility]  [phone] [place], [place] [address]

## 2010-11-04 NOTE — Assessment & Plan Note (Signed)
[facility]                                   ON-CALL NOTE   NAME:[name], [name]                        MRN:          [id number]  DATE:02/01/2006                            DOB:          1971/02/15    DATE OF INTERACTION:  02/01/2006, 6:18 p.m.   OBJECTIVE:  The patient has stomach cramps that are severe, has been  happening for a while.  He has been to the ER twice and nothing was found.  Was given hyoscyamine for cramps which made them worse.  He denies bright  red blood per rectum, vomiting, nausea or diarrhea.  He has known irritable  bowel syndrome.  He was on an antibiotic in the past for this.  There are no  known drug allergies.  He has been seen and is under extreme pain at the  time.   ASSESSMENT:  Probable irritable bowel, possibly other cause.   PLAN:  Will call in a prescription for Bentyl 10 mg b.i.d. today and then 4  times a day as needed, #20 and no refills.  Call the office to be seen as he  probably needs a gastrointestinal evaluation.  Avoid nicotine, caffeine,  tomato products, spicy foods, and any other foods that he knows is an  irritant.  Prescription was called in to [facility] at [phone] for Bentyl 10 mg.  He is on verapamil and Micardis.  Primary care physician is Dr. Drue [name].  Home  office is [employer].                                   Arta Silence, MD   RNS/MedQ  DD:  02/13/2006  DT:  02/13/2006  Job #:  [id number]   cc:   Joshua Ora, MD

## 2010-11-04 NOTE — Assessment & Plan Note (Signed)
[facility]                                   ON-CALL NOTE   NAME:[name], Cass                        MRN:          [id number]  DATE:02/01/2006                            DOB:          [birth year]    PHONE CONSULTS:   DATE OF INTERACTION:  02/01/2006 at 6:18 p.m.  Phone # [phone]   OBJECTIVE:  Patient has stomach cramps that are severe, been happening for a  while.  Has been to the ER twice, found nothing.  Was given hyoscyamine for  cramps which made them worse.  He denies bright red blood per rectum,  vomiting, nausea, diarrhea.  Has known irritable bowel syndrome.  Was on an  antibiotic in the past for that.  There are no known drug allergies.  Has  not been seen and is under extreme pain at this time.   ASSESSMENT:  Probable irritable bowel.  Possibly other cause.   PLAN:  1. Will call him in a prescription for Bentyl 10 mg b.i.d. today and then      four-times a day as needed #20 and no refills.  2. Call the office to be seen as he probably needs a gastrointestinal      evaluation.  3. Avoid nicotine, caffeine, tomato products, spicy foods and any other      foods that he knows is an irritant.  4. Patient has no drug allergies.  5. Prescription was [facility] into [facility] at [phone] for the Bentyl 10 mg.  He      is on Verapamil and Micardis.   PRIMARY CARE PHYSICIAN:  Primary care Saniyyah Elster is Dr. Drue Novel.  Home office is  [place].                                   Arta Silence, MD   RNS/MedQ  DD:  02/02/2006  DT:  02/02/2006  Job #:  [id number]   cc:   Willow Ora, MD

## 2010-11-04 NOTE — Discharge Summary (Signed)
NAME:  [name], [name]                         ACCOUNT NO.:  [id number]   MEDICAL RECORD NO.:  [id number]                   PATIENT TYPE:  INP   LOCATION:  0340                                 FACILITY:  [facility]   PHYSICIAN:  Rene Paci, M.D. [facility]          DATE OF BIRTH:  10/09/1970   DATE OF ADMISSION:  07/21/2002  DATE OF DISCHARGE:  07/23/2002                                 DISCHARGE SUMMARY   </DISCHARGE DIAGNOSIS>  Transient face and leg numbness secondary to complicated migraine, resolved.   DISCHARGE MEDICATIONS:  As prior to admission and include Prilosec, Zantac,  and baby aspirin daily.   CONSULTS:  Deanna Artis. Sharene Skeans, M.D.,  [facility].   PROCEDURE:  MRI/MRA of the brain on July 22, 2002, negative for any acute  stroke.  A single 2 mm focus of abnormal high flare signal on the white  matter of the right posterior parietal/occipital region of unclear  significance.  Differential diagnosis includes small vessel infarct versus  focusing demyelination; no diffusion positivity seen in this location.  MRA  is negative.   DISPOSITION:  The patient is discharged to home in medically stable and  improved condition.  Hospital follow-up is scheduled with his primary care  physician, Wanda Plump, MD, at [facility] on Monday, July 28, 2002, at 8:45. At that time, he will follow up on recurrence of his  complicated migraines, review MRI results and lab results that are still  pending at time of discharge.  He will also be considered for psychological  evaluation versus further neurological work-up.   HOSPITAL COURSE SUMMARY:  [name] is a 40 year old black gentleman, who  was seen in the office the day of admission for complaints of transient  numbness on and off on the left side of his face and complaints of left  extremity numbness but without weakness.  He had been scheduled for an  outpatient MRI to evaluate these symptoms but as they recurred  again on the  same day after evaluation, he was referred to the emergency room for  immediate attention.  He was seen by neurology consultation, who felt the  patient's symptoms were more consistent with complicated migraine but agreed  with coagulopathic work-up as was initiated by the primary M.D.  MRI results  are as above and felt by radiology to be unrelated to his symptoms.  Laboratory tests were negative for any antiphospholipid antibodies, and his  AT-III level was within normal limits.  His factor V Leiden, protein C and  S, and protein G 20210A mutation levels are all still pending at time of  discharge.  The patient understands the results as found to date and agrees  to follow up with his primary care physician as scheduled.  Rene Paci, M.D. [facility]    VL/MEDQ  D:  07/23/2002  T:  07/23/2002  Job:  [id number]

## 2010-11-04 NOTE — H&P (Signed)
NAME:  [name], [name]                         ACCOUNT NO.:  [id number]   MEDICAL RECORD NO.:  [id number]                   PATIENT TYPE:  INP   LOCATION:  0340                                 FACILITY:  [facility]   PHYSICIAN:  Wanda Plump, MD [facility]                 DATE OF BIRTH:  February 27, 1971   DATE OF ADMISSION:  07/21/2002  DATE OF DISCHARGE:                                HISTORY & PHYSICAL   CHIEF COMPLAINT:  Numbness in the face.   HISTORY OF PRESENT ILLNESS:  The patient is a 40 year old male who for the  last 2 days had experienced left facial numbness off and on, along with  slurred speech and left extremity numbness but no focal weakness .  These  symptoms are not associated with any nausea or vomiting or headache. He was  evaluated with such symptoms in the emergency room and at that time a basic  metabolic panel was negative and head CT scan without contrast was also  negative.   This morning he was seen in the office with the above symptoms. At that time  he also complained of some left facial pain rather than numbness. My  impression is that he was  probably having a neuralgia, but I was also  concerned because none of his symptoms were consistent with TIA.  Consequently he was  prescribed Neurontin which he never got to take, and  was referred for an outpatient MRI and MRA for neurology evaluation.   This never happened because this afternoon he got another episode and he  came to the ER. This last episode, however, is not left-sided, but rather is  numbness at the periumbilical area and bilateral jaw, and also associated  slurred speech. The decision was made then to admit the patient to the  hospital and neurology was consulted tonight. Of note, it is difficult to  get a good history on the patient because he is somehow vague describing his  symptoms.   PAST MEDICAL HISTORY:  Negative for any diabetes or surgeries.   SOCIAL HISTORY:  He smokes sometimes. He drinks  just about every other day,  and when he drinks he uses about a 6-pack. He strongly denies the use of any  illicit drugs.   FAMILY HISTORY:  1. Father has diabetes and hypertension.  2. Grandparents have coronary artery disease.  3. Mother has high blood pressure.  4. No history of strokes or seizures in the family.   REVIEW OF SYMPTOMS:  He denies any recent neck trauma or trauma in general.  No recent neck manipulation of any type. He does not have any fever, cough,  chest pain or shortness of breath. No nausea or vomiting or diarrhea.   MEDICATIONS:  None on a regular basis.   ALLERGIES:  No known drug allergies.   PHYSICAL EXAMINATION:  GENERAL:  At the time of my examination  in the  emergency room he is alert and oriented in no apparent distress. Coherent  and cooperative.  VITAL SIGNS:  Pulse 91, respiratory rate 40 when he arrived to the ER; when  I saw him was around 20. O2 saturation is 100%. He is afebrile.  LUNGS:  Clear to auscultation bilaterally.  CARDIOVASCULAR:  Regular rate and rhythm. He does have a questionable soft  systolic murmur.  ABDOMEN:  Not distended. Soft with good bowel sounds.  EXTREMITIES:  No edema.  NEUROLOGIC:  Speech is fluid and coherent. Motor examination is basically  normal. There is a questionable left-sided facial weakness in the corner of  the mouth. His gait was not tested. Extraocular movements intact. Pupils  equally round and reactive. Neck is full range of motion.   LABORATORY DATA:  Two days ago in the emergency room he had a normal CBC and  a normal basic metabolic panel. Drug screen done today was negative. The  rest of the labs are pending.   A head CT scan without was negative.   ASSESSMENT AND PLAN:  The patient is a young male who has been admitted with  symptoms suggestive of transient ischemic attack. The plan is to get him in  the hospital, do neurological checks, to an echocardiogram and MRI and MRA  of the brain.  Neurology has been consulted. They have ordered multiple blood  tests to help rule out any etiologies that could explain these symptoms of  TIA. At this time the patient seems to be stable, so we will make further  management decisions once the blood week comes back.                                               Wanda Plump, MD [facility]    JEP/MEDQ  D:  07/21/2002  T:  07/21/2002  Job:  [id number]

## 2010-11-04 NOTE — Assessment & Plan Note (Signed)
[facility]                                   ON-CALL NOTE   NAME:Lagman, Taras                        MRN:          [id number]  DATE:02/12/2006                            DOB:          09/05/1970    PHONE NUMBER:  [phone].   CALLER:  Abran Duke, patient's wife.   CHIEF COMPLAINT:  Diarrhea.   HISTORY OF PRESENT ILLNESS:  The patient had a diagnosis, she says, of acute  appendicitis on Wednesday, at [facility]. He was released Thursday, given  pain medications and antibiotics as per two CT scans.  He spoke to several  surgeons, but surgery was not done, and they are supposed to follow up with  their regular doctor.  However, now he is having nausea, vomiting and  diarrhea and pain is worse.  He does not feel well.  I told her to take him  back to the emergency room now, and that is what she is doing.                                   Marne A. Tower, MD   MAT/MedQ  DD:  02/12/2006  DT:  02/12/2006  Job #:  [id number]   cc:   Willow Ora, MD

## 2010-11-04 NOTE — H&P (Signed)
[name], [name]               ACCOUNT NO.:  [id number]   MEDICAL RECORD NO.:  [id number]          PATIENT TYPE:  INP   LOCATION:  0104                         FACILITY:  [facility]   PHYSICIAN:  Lebron Conners, M.D.   DATE OF BIRTH:  1971/02/15   DATE OF ADMISSION:  02/01/2006  DATE OF DISCHARGE:                                HISTORY & PHYSICAL   CHIEF COMPLAINT:  Abdominal pain.   PRESENT ILLNESS:  [name] is a generally healthy 40 year old black male  who is evaluated because of abdominal pain and CT finding of a mass or  abscess in the right lower quadrant of the abdomen.  He has been having  symptoms of intermittent crampy abdominal pain and not feeling so well and  having soreness of the abdomen for about 3 months, but it has been  considerably more prominent the last week.  He has been to the doctor once  but was not found to have any findings that warranted hospitalization or  imaging studies.  He came to the emergency room last night because of severe  crampy abdominal pain and right lower quadrant pain and was found to have a  tender abdomen, slightly elevated temperature but white count normal at  9000.  Urinalysis was unremarkable and chemistries were unremarkable.  A CT  scan of the abdomen and pelvis was obtained, which demonstrates findings of  an inflammatory mass and possible abscess in the right lower quadrant medial  to the cecum.  There is thickened bowel associated with it.  He is admitted  to the hospital for treatment of this process.   PAST MEDICAL HISTORY:  The patient has never had an operation.  He has  hypertension, and he is troubled by reflux.  He has no other diagnoses.   His medicines are AcipHex for reflux, verapamil and Micardis.   No known drug allergies.   The patient smokes 2 or 3 cigarettes a day.  He drinks 2-3 beers per day.  He denies taking any cocaine, marijuana or any other nonprescription drugs  except for occasional Tylenol or  Advil.   The family history and childhood illnesses are unremarkable.   A 15-point detailed review of systems is entirely negative except for what I  have already mentioned.   PHYSICAL EXAMINATION:  VITAL SIGNS:  The temperature and vital signs are as  recorded by the nursing staff and are unremarkable.  The temperature is at  100.2.  The pulses ranged 70-80 and the blood pressure about 115/70.  GENERAL:  The patient is in no acute distress.  Mental status is normal.  HEENT:  Head, neck, eyes, ears, nose, mouth and throat are unremarkable.  The thyroid is not enlarged and there is no neck mass.  Mucosa is normal.  The sclerae are anicteric.  CHEST:  Clear to auscultation and percussion, and there is no chest wall  tenderness.  HEART:  Rate and rhythm normal.  No murmur or gallop heard.  Peripheral  pulses full.  ABDOMEN:  Somewhat distended.  The bowel sounds are high-pitched and active.  The there is  tenderness across the mid and lower abdomen, greatest toward  the right lower quadrant.  Groin:  No hernia is noted.  No lymphadenopathy.  GENITOURINARY:  Normal male.  RECTAL:  Not performed.  EXTREMITIES:  No edema.  Good pulses.  SKIN:  No skin lesions seen.  NEUROLOGICAL:  Grossly intact.   IMPRESSION:  Inflammatory abdominal process, Crohn disease versus  appendicitis.  I tend to favor of Crohn disease.   PLAN:  I have hospitalized the patient and limit oral intake to water and  ice.  I will start him on Zosyn 3.375 g IV every 6 hours.  I will provide  symptomatic relief of nausea and pain with medication.  I am consulting  gastroenterology for their help and evaluation.      Lebron Conners, M.D.  Electronically Signed     WB/MEDQ  D:  02/02/2006  T:  02/02/2006  Job:  [id number]   cc:   Willow Ora, MD  [phone] [address] [place], [place] [address]

## 2010-11-04 NOTE — Op Note (Signed)
NAMEMONTA, POLICE               ACCOUNT NO.:  [id number]   MEDICAL RECORD NO.:  [id number]          PATIENT TYPE:  INP   LOCATION:  1619                         FACILITY:  [facility]   PHYSICIAN:  Lebron Conners, M.D.   DATE OF BIRTH:  [birth year]   DATE OF PROCEDURE:  03/21/2006  DATE OF DISCHARGE:                                 OPERATIVE REPORT   PREOPERATIVE DIAGNOSIS:  Inflammatory the abdominal mass, possible Crohn's  disease.   POSTOPERATIVE DIAGNOSIS:  Perforated appendicitis with inflammatory mass.   OPERATION:  Exploratory laparotomy, lysis of adhesions, biopsy of  retroperitoneum, appendectomy.   SURGEON:  Lebron Conners, M.D.   ANESTHESIA:  General.   PROCEDURE:  After the patient was monitored and asleep and had a Foley  catheter and then had routine preparation and draping of the abdomen, I made  a lower midline incision starting just below the umbilicus and going down  toward the pubic symphysis.  I entered the peritoneal cavity carefully  noting that no viscera were injured.  I then noted that there was a very  small amount of clear fluid present.  The small bowel was normal in caliber  and had very smooth with a healthy appearance.  I could feel a mass which  was fairly mobile and approximately 4-5 cm in diameter over the promontory  of the sacrum.  The pelvis felt free of adhesions and masses.  There were  adhesions along the right gutter between the liver and the abdominal wall.  I packed the small bowel upward, found that a loop of small bowel involved  the mass which I thought was an inflammatory mass.  I carefully pinched the  small bowel away, it came away nicely, and I thoroughly inspected and found  that its integrity was good, that the lumen was not compromised, and that  bleeding from it was well controlled.  I later inspected it again and found  no problems.  I found that the appendix went right into the mass and that  the tip of the appendix was  expanded and very firm.  I dissected outside the  appendix thinking that it was somewhat possible that this might be tumor,  but could not stay completely out of the mass without provoking quite a bit  of bleeding.  When I saw that I would not be able to resect the mass very  cleanly, I sent a frozen section and Dr. Luisa Hart reported that all the  tissue he saw was inflammatory in nature and that he saw no tumor.  I went  ahead and dissected the tip of the appendix free and then ligated the  appendiceal artery with 2-0 silk and ligated the base of the appendix with 2-  0 silk and then did an appendectomy.  I sent the appendix over and Dr.  Luisa Hart inspected it and reported that he saw no tumor.  I copiously  irrigated the area and cauterized small bleeders.  The appendectomy site  looked secure.  The small bowel looked good.  The sponge,  needle and instrument counts were correct.  I  replaced the small bowel into  the pelvis and then closed the abdomen with running #1 PDS, irrigated the  subcutaneous tissues, and closed the skin with staples.  He tolerated the  operation well.      Lebron Conners, M.D.  Electronically Signed     WB/MEDQ  D:  03/21/2006  T:  03/23/2006  Job:  [id number]   cc:   Willow Ora, MD  [phone] [address] [place], [place] [address]

## 2010-11-04 NOTE — Discharge Summary (Signed)
NAMEWISSAM, RESOR               ACCOUNT NO.:  [id number]   MEDICAL RECORD NO.:  [id number]          PATIENT TYPE:  INP   LOCATION:  1614                         FACILITY:  [facility]   PHYSICIAN:  Wilmon Arms. Corliss Skains, M.D. DATE OF BIRTH:  11/15/1970   DATE OF ADMISSION:  02/01/2006  DATE OF DISCHARGE:  02/07/2006                                 DISCHARGE SUMMARY   ADMITTING DIAGNOSES:  Right lower quadrant inflammatory mass, possible  Crohn's disease versus appendicitis.   DISCHARGE DIAGNOSES:  Chronic appendicitis with pelvic phlegmon.   BRIEF HISTORY:  The patient is a 40 year old male who presented to the  emergency department on February 01, 2006 with a three-month history of crampy  lower abdominal pain.  This has worsened over the last week or so.  His  temperature is slightly elevated but his white count was normal.  He  underwent a CT scan which showed an inflammatory mass in the right lower  quadrant medial to the cecum with some thickened surrounding small bowel.  The differential diagnosis included inflammatory bowel disease versus some  type of chronic appendiceal infection.  Of note, the patient had a CT scan  in December of 2006 for a different reason.  This showed a very long  appendix heading down into the right side of the pelvis.   HOSPITAL COURSE:  The patient was admitted to the hospital and was started  on intravenous antibiotics.  He was consulted by gastroenterology who agreed  with the differential diagnosis.  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  Patient improved on antibiotics.  He started feeling better, had  no abdominal pain.  Due to his clinical improvement he underwent a  colonoscopy by GI today.  This showed no evidence of inflammation in the  terminal ileum.  The entire colonoscopy was essentially unremarkable.  At  this point it appears that this may represent a chronic inflammatory process  involving the appendix.  Due to the patient's  current clinical improvement  on antibiotics the decision is made to discharge him on oral antibiotics.  He will likely need a repeat CT scan in the new few weeks with an interval  appendectomy at some point.  He is instructed that if he has any increasing  pain, fever that he should return or call the office.   DISCHARGE INSTRUCTIONS:  Follow up with Dr. Orson Slick in two weeks.  He is  given Cipro 500 mg p.o. b.i.d. and Flagyl 500 mg t.i.d.  He is also given a  prescription for p.r.n. Percocet.      Wilmon Arms. Tsuei, M.D.  Electronically Signed     MKT/MEDQ  D:  02/07/2006  T:  02/08/2006  Job:  [id number]   cc:   Venita Lick. Russella Dar, MD, FACG  [address]  [place]  [place] [address]

## 2010-11-04 NOTE — Assessment & Plan Note (Signed)
[facility]                                 ON-CALL NOTE   NAME:[name], Joshua                        MRN:          [id number]  DATE:08/04/2007                            DOB:          [birth year]    PHONE NUMBER:  [phone].  Patient of Dr. Drue Novel.   The patient is taking 2 blood pressure medicines.  He does not know what  the names are.  He left one out of town.  He does not know what his  blood pressure is.  He has only skipped one dose, and that was today.  Advised to take 1 of his blood pressure medicines today and call to see  his doctor tomorrow.     Jeffrey A. Tawanna Cooler, MD  Electronically Signed    JAT/MedQ  DD: 08/04/2007  DT: 08/05/2007  Job #: [id number]

## 2010-11-15 ENCOUNTER — Other Ambulatory Visit: Payer: Self-pay

## 2010-11-15 NOTE — Telephone Encounter (Signed)
Called pt home out on truck got another # from pt son [phone] Left detailed to contact office for refill if he has run out of medication again.
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
## 2010-11-24 ENCOUNTER — Encounter: Payer: Self-pay | Admitting: Internal Medicine

## 2010-11-24 ENCOUNTER — Ambulatory Visit (INDEPENDENT_AMBULATORY_CARE_PROVIDER_SITE_OTHER): Payer: 59 | Admitting: Internal Medicine

## 2010-11-24 VITALS — BP 130/84 | HR 91 | Wt 205.4 lb

## 2010-11-24 DIAGNOSIS — R21 Rash and other nonspecific skin eruption: Secondary | ICD-10-CM

## 2010-11-24 DIAGNOSIS — Z113 Encounter for screening for infections with a predominantly sexual mode of transmission: Secondary | ICD-10-CM

## 2010-11-24 NOTE — Progress Notes (Signed)
  Subjective:    Patient ID: [name], male    DOB: February 16, 1971, 40 y.o.   MRN: [id number]  HPI F/u on a rash at inner legs: better Today he reports random pain at buttocks and posterior leg , like a burning feeling, may last hours at times   Past Medical History  Diagnosis Date  . Hypertension   . Migraine   . GERD (gastroesophageal reflux disease)   . Anxiety   . Facial numbness 2004    ?small vessel disease; (-) hyper-coag profile   Past Surgical History  Procedure Date  . Appendectomy      Review of Systems No fever No back pain, no b/b incontinence    Objective:   Physical Exam  Constitutional: He is oriented to person, place, and time. He appears well-developed and well-nourished.  Musculoskeletal: He exhibits no edema.       No tender to palpation at lower back  Neurological: He is alert and oriented to person, place, and time. He has normal reflexes. He exhibits normal muscle tone. Coordination normal.  Skin:       Previously seen rash @ inner legs  is gone           Assessment & Plan:  RASH-- resolved Tingling in the legs of unclear etiology, neuro exam normal, rec observation. Pt again likes to be checked for herpes, he denies any past or recurrent blistery genital rash. Explained that even if test is + his chances to pass it to his new girlfriend is low ; also discussed that  if he likes to decreaswe the risk even lower he could take medicine daily. That been said, we don't know the herpes status of his GF.,  Today , I spent more than 15  min with the patient, >50% of the time counseling, see above

## 2010-11-24 NOTE — Assessment & Plan Note (Signed)
See above

## 2010-11-28 ENCOUNTER — Telehealth: Payer: Self-pay | Admitting: *Deleted

## 2010-11-28 NOTE — Telephone Encounter (Signed)
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

## 2010-11-28 NOTE — Telephone Encounter (Signed)
Message left for patient to return my call.  

## 2010-11-29 NOTE — Telephone Encounter (Signed)
agree

## 2010-11-29 NOTE — Telephone Encounter (Signed)
Pt is aware of test results. Pt asked what he needed to do at this point. I told pt if he has an outbreak to give [place] call.

## 2011-01-02 ENCOUNTER — Other Ambulatory Visit: Payer: Self-pay | Admitting: Internal Medicine

## 2011-01-30 ENCOUNTER — Other Ambulatory Visit: Payer: Self-pay | Admitting: Internal Medicine

## 2011-01-30 NOTE — Telephone Encounter (Signed)
Rx Done . 

## 2011-01-31 ENCOUNTER — Other Ambulatory Visit: Payer: Self-pay | Admitting: Internal Medicine

## 2011-01-31 NOTE — Telephone Encounter (Signed)
Rx Done . 

## 2011-02-10 ENCOUNTER — Other Ambulatory Visit: Payer: Self-pay | Admitting: Internal Medicine

## 2011-02-10 NOTE — Telephone Encounter (Signed)
Rx Done . 

## 2011-02-14 ENCOUNTER — Other Ambulatory Visit: Payer: Self-pay | Admitting: Internal Medicine

## 2011-02-14 NOTE — Telephone Encounter (Signed)
Rx Done . 

## 2011-02-28 ENCOUNTER — Other Ambulatory Visit: Payer: Self-pay | Admitting: Internal Medicine

## 2011-03-09 ENCOUNTER — Ambulatory Visit (INDEPENDENT_AMBULATORY_CARE_PROVIDER_SITE_OTHER): Payer: 59 | Admitting: Internal Medicine

## 2011-03-09 ENCOUNTER — Encounter: Payer: Self-pay | Admitting: Internal Medicine

## 2011-03-09 VITALS — BP 130/82 | HR 92 | Temp 98.8°F | Ht 73.5 in | Wt 207.8 lb

## 2011-03-09 DIAGNOSIS — M7040 Prepatellar bursitis, unspecified knee: Secondary | ICD-10-CM | POA: Insufficient documentation

## 2011-03-09 NOTE — Progress Notes (Signed)
  Subjective:    Patient ID: [name], male    DOB: 08/09/1970, 40 y.o.   MRN: [id number]  HPI 1 month h/o on and off right leg swelling. The patient has heavy physical work, he unload  trucks etc.  Past Medical History  Diagnosis Date  . Hypertension   . Migraine   . GERD (gastroesophageal reflux disease)   . Anxiety   . Facial numbness 2004    ?small vessel disease; (-) hyper-coag profile  . ANXIETY 06/03/2007  . GERD 06/03/2007  . HYPERTENSION 11/22/2006  . MIGRAINE HEADACHE 11/22/2006  . Rash 10/18/2010   Past Surgical History  Procedure Date  . Appendectomy      Review of Systems No fever or chills No nausea or vomiting Left knee normal No history of previous injury or surgery on the right knee.    Objective:   Physical Exam  Alert, oriented, in no apparent distress Left leg normal Right knee: range of motion normal, not tender to palpation on the sides, joint is stable, he does have a very prominent and swollen prepatellar bursa. No red or hot.       Assessment & Plan:

## 2011-03-09 NOTE — Assessment & Plan Note (Signed)
Prepatellar bursitis, recommend to see ortho for consideration of aspiration and steroid injection. In the meantime I recommend avoidance of further injury, Motrin, use of knee pads

## 2011-04-18 ENCOUNTER — Encounter: Payer: Self-pay | Admitting: Internal Medicine

## 2011-04-18 ENCOUNTER — Ambulatory Visit (INDEPENDENT_AMBULATORY_CARE_PROVIDER_SITE_OTHER): Payer: 59 | Admitting: Internal Medicine

## 2011-04-18 VITALS — BP 118/70 | HR 90 | Temp 98.3°F | Resp 18 | Ht 72.05 in | Wt 203.5 lb

## 2011-04-18 DIAGNOSIS — I10 Essential (primary) hypertension: Secondary | ICD-10-CM

## 2011-04-18 DIAGNOSIS — Z136 Encounter for screening for cardiovascular disorders: Secondary | ICD-10-CM

## 2011-04-18 DIAGNOSIS — Z0100 Encounter for examination of eyes and vision without abnormal findings: Secondary | ICD-10-CM

## 2011-04-18 NOTE — Patient Instructions (Signed)
You are due for a physical, please schedule at your earliest convenience

## 2011-04-18 NOTE — Progress Notes (Signed)
  Subjective:    Patient ID: [name], male    DOB: 10-May-1971, 40 y.o.   MRN: [id number]  HPI ROV, also needs a  DOT physical. Feeling well. Hypertension, good medication compliance, BP today normal. Would like to change to generics at some point   Past Medical History  Diagnosis Date  . Hypertension   . Migraine   . GERD (gastroesophageal reflux disease)   . Anxiety   . Facial numbness 2004    ?small vessel disease; (-) hyper-coag profile  . ANXIETY 06/03/2007  . GERD 06/03/2007  . HYPERTENSION 11/22/2006  . MIGRAINE HEADACHE 11/22/2006  . Rash 10/18/2010   Past Surgical History  Procedure Date  . Appendectomy     Review of Systems Feeling well, no chest pain, shortness of breath, nausea, vomiting or diarrhea. No palpitations or near syncope     Objective:   Physical Exam  Constitutional: He is oriented to person, place, and time. He appears well-developed.  Cardiovascular: Normal rate, regular rhythm and normal heart sounds.   No murmur heard. Pulmonary/Chest: Effort normal and breath sounds normal. No respiratory distress. He has no wheezes. He has no rales.  Musculoskeletal: He exhibits no edema.  Neurological: He is oriented to person, place, and time.   Please see above , also further documentation at his DOT form (scanned )       Assessment & Plan:  DOT-- performed, see scanned form

## 2011-04-18 NOTE — Assessment & Plan Note (Addendum)
Well-controlled, samples provided, EKG today normal, does have PACs (asx), no old EKGs. He is due for a physical, see instructions

## 2011-05-04 ENCOUNTER — Other Ambulatory Visit: Payer: Self-pay | Admitting: Internal Medicine

## 2011-05-29 ENCOUNTER — Telehealth: Payer: Self-pay | Admitting: Internal Medicine

## 2011-05-31 NOTE — Telephone Encounter (Signed)
Left Pt detail message samples placed up front for pick up.

## 2011-05-31 NOTE — Telephone Encounter (Signed)
Ok to give a month supply of available samples

## 2011-07-19 ENCOUNTER — Telehealth: Payer: Self-pay | Admitting: Internal Medicine

## 2011-07-19 DIAGNOSIS — Z0279 Encounter for issue of other medical certificate: Secondary | ICD-10-CM

## 2011-07-19 NOTE — Telephone Encounter (Signed)
Pt called back and will bring a form from his employer to be filled out. He is also going to bring the old form that was filled out that they can not use for [place] to see.

## 2011-07-25 NOTE — Telephone Encounter (Signed)
Completed this morning.

## 2011-07-26 ENCOUNTER — Other Ambulatory Visit (INDEPENDENT_AMBULATORY_CARE_PROVIDER_SITE_OTHER): Payer: 59

## 2011-07-26 DIAGNOSIS — Z0000 Encounter for general adult medical examination without abnormal findings: Secondary | ICD-10-CM

## 2011-07-26 LAB — POCT URINALYSIS DIPSTICK
Glucose, UA: NEGATIVE
Ketones, UA: NEGATIVE
Spec Grav, UA: 1.015

## 2011-08-23 ENCOUNTER — Telehealth: Payer: Self-pay | Admitting: Internal Medicine

## 2011-08-23 MED ORDER — VERAPAMIL HCL ER 240 MG PO TBCR: 240.0000 mg | EXTENDED_RELEASE_TABLET | Freq: Every day | ORAL | Status: DC

## 2011-08-23 NOTE — Telephone Encounter (Signed)
Verapamil HCl (Tab CR) CALAN-SR 240 MG TAKE ONE TABLET BY MOUTH DAILY

## 2011-08-23 NOTE — Telephone Encounter (Signed)
Refill done.  

## 2011-08-29 ENCOUNTER — Other Ambulatory Visit: Payer: Self-pay | Admitting: Internal Medicine

## 2011-09-26 ENCOUNTER — Other Ambulatory Visit: Payer: Self-pay | Admitting: Internal Medicine

## 2011-09-26 NOTE — Telephone Encounter (Signed)
Refill done.  

## 2011-12-28 ENCOUNTER — Other Ambulatory Visit: Payer: Self-pay | Admitting: Internal Medicine

## 2011-12-28 MED ORDER — TELMISARTAN 80 MG PO TABS: 80.0000 mg | ORAL_TABLET | Freq: Every day | ORAL | Status: DC

## 2011-12-28 NOTE — Telephone Encounter (Signed)
refill Telmisartan (Tab) MICARDIS 80 MG TAKE ONE TABLET BY MOUTH ONE TIME DAILY Take one tablet by mouth one time a day # 30 Last fill 6.10.13 Last ov 10.30.12

## 2011-12-28 NOTE — Telephone Encounter (Signed)
Refill done.  

## 2012-02-16 ENCOUNTER — Other Ambulatory Visit: Payer: Self-pay | Admitting: Internal Medicine

## 2012-02-16 NOTE — Telephone Encounter (Signed)
Refill done.  

## 2012-02-26 ENCOUNTER — Telehealth: Payer: Self-pay | Admitting: Internal Medicine

## 2012-02-26 NOTE — Telephone Encounter (Signed)
Patient would like samples of Micardis. Call mobile # if/when available.

## 2012-02-26 NOTE — Telephone Encounter (Signed)
Okay #30 if available, also he is due for a CPX. Please schedule.

## 2012-02-27 NOTE — Telephone Encounter (Signed)
Pt would like to pick up today if possible-May want to put a note in his bag as per Dr.Paz below

## 2012-02-27 NOTE — Telephone Encounter (Signed)
Pt aware samples are ready to be picked up at front desk & informed to schedule a CPX.

## 2012-02-28 ENCOUNTER — Other Ambulatory Visit: Payer: Self-pay | Admitting: Internal Medicine

## 2012-02-28 MED ORDER — VERAPAMIL HCL ER 240 MG PO TBCR: 240.0000 mg | EXTENDED_RELEASE_TABLET | Freq: Every day | ORAL | Status: DC

## 2012-02-28 NOTE — Telephone Encounter (Signed)
REFILL Verapamil  240 MG TAKE ONE ( 1 ) TABLET BY MOUTH DAILY #30 LAST WRT 3.12.13 #30 wt/5-refills Last ov 10.30.12 f/u 401.9

## 2012-02-28 NOTE — Telephone Encounter (Signed)
Refill done.  

## 2012-03-25 ENCOUNTER — Other Ambulatory Visit: Payer: Self-pay | Admitting: Internal Medicine

## 2012-03-25 NOTE — Telephone Encounter (Signed)
Refill done.  

## 2012-04-22 ENCOUNTER — Other Ambulatory Visit: Payer: Self-pay | Admitting: Internal Medicine

## 2012-04-22 NOTE — Telephone Encounter (Signed)
Refill done.  

## 2012-04-22 NOTE — Telephone Encounter (Signed)
Pt hasn't been seen within a year. Ok to refill?

## 2012-04-22 NOTE — Telephone Encounter (Signed)
Okay #30, no refills, no further medications without office visit

## 2012-05-23 ENCOUNTER — Other Ambulatory Visit: Payer: Self-pay | Admitting: Internal Medicine

## 2012-05-23 NOTE — Telephone Encounter (Signed)
Back on 04/22/2012 with told pt no further refills without medication. Call patient, make an appointment within 3 weeks, if he is willing to come , refill #21. Otherwise no refill.

## 2012-05-23 NOTE — Telephone Encounter (Signed)
Pt has not been seen within a year. OK to refill? 

## 2012-05-24 NOTE — Telephone Encounter (Signed)
lmovm for pt to return call.  

## 2012-05-27 NOTE — Telephone Encounter (Signed)
Pt called for refill status advised needs OV, pt stated he is getting ready to go to [place] til after Christmas, also has new insurance going into effect 06/19/12, appt scheduled to accommodate pt schedule 1.7.14 at 3pm  Pt also needs samples of Micardis cb# [phone]

## 2012-05-27 NOTE — Telephone Encounter (Signed)
Refill done.  

## 2012-06-25 ENCOUNTER — Ambulatory Visit: Payer: 59 | Admitting: Internal Medicine

## 2012-06-26 ENCOUNTER — Other Ambulatory Visit: Payer: Self-pay | Admitting: Internal Medicine

## 2012-06-26 NOTE — Telephone Encounter (Signed)
Okay one-month supply, no further refills without visit.

## 2012-06-26 NOTE — Telephone Encounter (Signed)
Pt has not been seen with a year. OK to refill?

## 2012-06-27 ENCOUNTER — Encounter: Payer: Self-pay | Admitting: *Deleted

## 2012-06-27 NOTE — Telephone Encounter (Signed)
Refill done.  

## 2012-07-01 ENCOUNTER — Encounter: Payer: Self-pay | Admitting: Internal Medicine

## 2012-07-01 ENCOUNTER — Ambulatory Visit: Payer: PRIVATE HEALTH INSURANCE | Admitting: Internal Medicine

## 2012-07-01 NOTE — Progress Notes (Unsigned)
Patient dismissal letter in progress and cancelled/no show appointment rescheduled, simultaneously.  Verbally advised patient of dismissal from practice since several no show/late cancellations during the past year.  Patient did not pay the required co-pay today.  I apologized to pt he had waited and would not be seen today.  He acknowledged understanding of reason for dismissal from the practice, and would only receive emergency care.  I asked if he had received the letter, and he advised, "no, I don't live at that address anymore.  I live in [place]."  Patient then got up and walked out of office.

## 2012-07-02 ENCOUNTER — Telehealth: Payer: Self-pay | Admitting: Internal Medicine

## 2012-07-02 NOTE — Telephone Encounter (Signed)
Dismissal Letter sent by Certified Mail 07/02/2012  Dismissal Letter was returned from the address [address], [place] Attempted Not Known on 07/05/2012

## 2012-07-25 ENCOUNTER — Other Ambulatory Visit: Payer: Self-pay | Admitting: Internal Medicine

## 2012-07-25 NOTE — Telephone Encounter (Signed)
Pt was dismissed on 1.9.14. OK to refill?

## 2012-07-25 NOTE — Telephone Encounter (Signed)
We still have a duty to refill his medication until 07/28/2012. Please call the patient, explained him that we're going to call a two-week supply, no further refills from this office. Please be sure he got the certified letter.

## 2012-07-26 ENCOUNTER — Ambulatory Visit (INDEPENDENT_AMBULATORY_CARE_PROVIDER_SITE_OTHER): Payer: PRIVATE HEALTH INSURANCE | Admitting: Family Medicine

## 2012-07-26 VITALS — BP 147/96 | HR 80 | Temp 98.3°F | Resp 16 | Ht 71.5 in | Wt 205.0 lb

## 2012-07-26 DIAGNOSIS — Z720 Tobacco use: Secondary | ICD-10-CM

## 2012-07-26 DIAGNOSIS — I10 Essential (primary) hypertension: Secondary | ICD-10-CM

## 2012-07-26 DIAGNOSIS — F17200 Nicotine dependence, unspecified, uncomplicated: Secondary | ICD-10-CM

## 2012-07-26 MED ORDER — VERAPAMIL HCL ER 240 MG PO TBCR
240.0000 mg | EXTENDED_RELEASE_TABLET | Freq: Every day | ORAL | Status: DC
Start: 2012-07-26 — End: 2012-07-26

## 2012-07-26 MED ORDER — HYDROCHLOROTHIAZIDE 25 MG PO TABS: 25.0000 mg | ORAL_TABLET | Freq: Every day | ORAL | Status: DC

## 2012-07-26 MED ORDER — TELMISARTAN 80 MG PO TABS: 80.0000 mg | ORAL_TABLET | Freq: Every day | ORAL | Status: DC

## 2012-07-26 MED ORDER — VERAPAMIL HCL ER 240 MG PO TBCR: 240.0000 mg | EXTENDED_RELEASE_TABLET | Freq: Every day | ORAL | Status: DC

## 2012-07-26 NOTE — Progress Notes (Signed)
Subjective:    Patient ID: [name], male    DOB: 12/09/1970, 42 y.o.   MRN: [id number]  HPI [name] is a 42 y.o. male Here as new patient to establish care - prior pt of Dr. Leta Jungling office.   HTN - takes HCTZ 25mg  qd, and verapamil 240mg  qd, and Micardis 80mg  qd.  No recent missed doses - ran out today.  No outside BP's. layed off from work, stress with this.  No new side effects of meds.   Not taking aciphex anymore - not needed and no recent reflux.  Not fasting as had some coca cola at 12pm.   No recent bloodwork for renal function.   Review of Systems  Constitutional: Negative for fatigue and unexpected weight change.  Eyes: Negative for visual disturbance.  Respiratory: Negative for cough, chest tightness and shortness of breath.   Cardiovascular: Negative for chest pain, palpitations and leg swelling.  Gastrointestinal: Negative for abdominal pain and blood in stool.  Genitourinary: Negative for decreased urine volume and difficulty urinating.  Neurological: Negative for dizziness, light-headedness and headaches.  Psychiatric/Behavioral: The patient is nervous/anxious (stress with job and recent loss of job. ).        Objective:   Physical Exam  Vitals reviewed. Constitutional: He is oriented to person, place, and time. He appears well-developed and well-nourished.  HENT:  Head: Normocephalic and atraumatic.  Eyes: EOM are normal. Pupils are equal, round, and reactive to light.  Neck: No JVD present. Carotid bruit is not present.  Cardiovascular: Normal rate, regular rhythm and normal heart sounds.   No murmur heard. Pulmonary/Chest: Effort normal and breath sounds normal. He has no rales.  Musculoskeletal: He exhibits no edema.  Neurological: He is alert and oriented to person, place, and time.  Skin: Skin is warm and dry.  Psychiatric: He has a normal mood and affect.        Assessment & Plan:  [name] is a 42 y.o. male 1. Essential  hypertension, benign  Comprehensive metabolic panel, verapamil (CALAN-SR) 240 MG CR tablet, telmisartan (MICARDIS) 80 MG tablet, hydrochlorothiazide (HYDRODIURIL) 25 MG tablet, DISCONTINUED: verapamil (CALAN-SR) 240 MG CR tablet, DISCONTINUED: telmisartan (MICARDIS) 80 MG tablet, DISCONTINUED: hydrochlorothiazide (HYDRODIURIL) 25 MG tablet  2. Tobacco abuse     HTN - borderline.  Some increased social stressors.  Will continue same doses for now, but discussed need for checking outside bp's and if remains elevated - rtc to eval options.  Can also discuss med changes for cost reasons if needed. He thinks one of the prior combos he had an allergy - has at home and will call with details. Check CMP.    Discussed need to establish one primary provider, and can call and schedule CPE with them in next 6 months.   Tobacco abuse - discussed cessation and to let us know if assistance needed.  Meds ordered this encounter  Medications  . DISCONTD: verapamil (CALAN-SR) 240 MG CR tablet    Sig: Take 1 tablet (240 mg total) by mouth at bedtime.    Dispense:  90 tablet    Refill:  0  . DISCONTD: telmisartan (MICARDIS) 80 MG tablet    Sig: Take 1 tablet (80 mg total) by mouth daily.    Dispense:  90 tablet    Refill:  0  . DISCONTD: hydrochlorothiazide (HYDRODIURIL) 25 MG tablet    Sig: Take 1 tablet (25 mg total) by mouth daily.    Dispense:  90 tablet  Refill:  0  . verapamil (CALAN-SR) 240 MG CR tablet    Sig: Take 1 tablet (240 mg total) by mouth at bedtime.    Dispense:  90 tablet    Refill:  1  . telmisartan (MICARDIS) 80 MG tablet    Sig: Take 1 tablet (80 mg total) by mouth daily.    Dispense:  90 tablet    Refill:  1  . hydrochlorothiazide (HYDRODIURIL) 25 MG tablet    Sig: Take 1 tablet (25 mg total) by mouth daily.    Dispense:  90 tablet    Refill:  1   Patient Instructions  Your should receive a call or letter about your lab results within the next week to 10 days. Keep a record  of your blood pressures outside of the office and bring them to the next office visit. If the blood pressure remains above 140/90 - return to clinic to discuss medication options.   Call and schedule physical appointment with chosen primary care provider in next 6 months, as you will need an office visit for further med refills.  You can read more about our providers and practice at the website [email or web address].  We can discuss tobacco cessation options when you are ready too quit smoking.

## 2012-07-26 NOTE — Patient Instructions (Addendum)
Your should receive a call or letter about your lab results within the next week to 10 days. Keep a record of your blood pressures outside of the office and bring them to the next office visit. If the blood pressure remains above 140/90 - return to clinic to discuss medication options.   Call and schedule physical appointment with chosen primary care provider in next 6 months, as you will need an office visit for further med refills.  You can read more about our providers and practice at the website [email or web address].  We can discuss tobacco cessation options when you are ready too quit smoking.

## 2012-07-26 NOTE — Telephone Encounter (Signed)
Called pt, unable to leave a msg. Refilled #15.

## 2012-07-27 LAB — COMPREHENSIVE METABOLIC PANEL
ALT: 27 U/L (ref 0–53)
AST: 18 U/L (ref 0–37)
Alkaline Phosphatase: 59 U/L (ref 39–117)
Calcium: 10.2 mg/dL (ref 8.4–10.5)
Chloride: 102 mEq/L (ref 96–112)
Creat: 1.13 mg/dL (ref 0.50–1.35)
Potassium: 4.6 mEq/L (ref 3.5–5.3)

## 2012-08-14 ENCOUNTER — Other Ambulatory Visit: Payer: Self-pay | Admitting: Internal Medicine

## 2012-08-15 NOTE — Telephone Encounter (Signed)
Refill denied 

## 2012-08-15 NOTE — Telephone Encounter (Signed)
Needs to call new PCP

## 2012-08-15 NOTE — Telephone Encounter (Signed)
Pt was dismissed on 1.9.14 Please advise.

## 2012-08-31 ENCOUNTER — Other Ambulatory Visit: Payer: Self-pay | Admitting: *Deleted

## 2012-08-31 DIAGNOSIS — I10 Essential (primary) hypertension: Secondary | ICD-10-CM

## 2012-08-31 MED ORDER — VERAPAMIL HCL ER 240 MG PO TBCR: 240.0000 mg | EXTENDED_RELEASE_TABLET | Freq: Every day | ORAL | Status: DC

## 2012-08-31 MED ORDER — TELMISARTAN 80 MG PO TABS: 80.0000 mg | ORAL_TABLET | Freq: Every day | ORAL | Status: DC

## 2012-08-31 MED ORDER — HYDROCHLOROTHIAZIDE 25 MG PO TABS: 25.0000 mg | ORAL_TABLET | Freq: Every day | ORAL | Status: DC

## 2013-01-25 ENCOUNTER — Other Ambulatory Visit: Payer: Self-pay | Admitting: Family Medicine

## 2013-02-25 ENCOUNTER — Other Ambulatory Visit: Payer: Self-pay | Admitting: Family Medicine

## 2013-03-15 ENCOUNTER — Telehealth: Payer: Self-pay

## 2013-03-15 ENCOUNTER — Other Ambulatory Visit: Payer: Self-pay | Admitting: Physician Assistant

## 2013-03-15 DIAGNOSIS — I10 Essential (primary) hypertension: Secondary | ICD-10-CM

## 2013-03-15 NOTE — Telephone Encounter (Signed)
Patient wants to know if he can have a refill of Verpamil to last him until Monday. Patient states he is unable to come in for OV because he does not have the money to pay the copay. [facility].   [phone]

## 2013-03-16 MED ORDER — VERAPAMIL HCL ER 240 MG PO TBCR: 240.0000 mg | EXTENDED_RELEASE_TABLET | Freq: Every day | ORAL | Status: DC

## 2013-03-16 NOTE — Telephone Encounter (Signed)
Sent medication to the pharmacy for 1 month supply.

## 2013-03-16 NOTE — Telephone Encounter (Signed)
Called pt, unable to leave VM

## 2013-04-21 ENCOUNTER — Ambulatory Visit (INDEPENDENT_AMBULATORY_CARE_PROVIDER_SITE_OTHER): Payer: BC Managed Care – PPO | Admitting: Family Medicine

## 2013-04-21 VITALS — BP 140/90 | HR 86 | Temp 98.2°F | Resp 16 | Ht 72.5 in | Wt 189.0 lb

## 2013-04-21 DIAGNOSIS — I10 Essential (primary) hypertension: Secondary | ICD-10-CM

## 2013-04-21 LAB — COMPREHENSIVE METABOLIC PANEL
ALT: 16 U/L (ref 0–53)
AST: 16 U/L (ref 0–37)
Albumin: 4.2 g/dL (ref 3.5–5.2)
BUN: 15 mg/dL (ref 6–23)
Calcium: 9.4 mg/dL (ref 8.4–10.5)
Chloride: 105 mEq/L (ref 96–112)
Potassium: 4.3 mEq/L (ref 3.5–5.3)

## 2013-04-21 MED ORDER — TELMISARTAN 80 MG PO TABS: 80.0000 mg | ORAL_TABLET | Freq: Every day | ORAL | Status: DC

## 2013-04-21 MED ORDER — HYDROCHLOROTHIAZIDE 25 MG PO TABS: 25.0000 mg | ORAL_TABLET | Freq: Every day | ORAL | Status: DC

## 2013-04-21 MED ORDER — VERAPAMIL HCL ER 240 MG PO TBCR: EXTENDED_RELEASE_TABLET | ORAL | Status: DC

## 2013-04-21 NOTE — Patient Instructions (Signed)
I will be in touch with your labs when they come in. You can take 2 of the 40mg  micardis samples to equal your usual dose of micardis

## 2013-04-21 NOTE — Progress Notes (Signed)
[facility] [address] [place] [address] [phone]  Date:  04/21/2013   Name:  [name]   DOB:  09-17-1970   MRN:  [id number]  PCP:  No primary provider on file.    Chief Complaint: Medication Refill   History of Present Illness:  [name] is a 42 y.o. very pleasant male patient who presents with the following:  He needs refills of his HTN medications.  He ran out of the verapamil just yesterday.  He is still taking his other two medicatoins.   He does not check his BP.   He declines a flu shot today 2 Patient Active Problem List   Diagnosis Date Noted  . Bursitis, prepatellar 03/09/2011  . Rash 10/18/2010  . ANXIETY 06/03/2007  . GERD 06/03/2007  . MIGRAINE HEADACHE 11/22/2006  . HYPERTENSION 11/22/2006    Past Medical History  Diagnosis Date  . Hypertension   . Migraine   . GERD (gastroesophageal reflux disease)   . Anxiety   . Facial numbness 2004    ?small vessel disease; (-) hyper-coag profile  . ANXIETY 06/03/2007  . GERD 06/03/2007  . HYPERTENSION 11/22/2006  . MIGRAINE HEADACHE 11/22/2006  . Rash 10/18/2010    Past Surgical History  Procedure Laterality Date  . Appendectomy      History  Substance Use Topics  . Smoking status: Current Every Day Smoker -- 0.25 packs/day  . Smokeless tobacco: Not on file  . Alcohol Use: Yes     Comment: socially    Family History  Problem Relation Age of Onset  . Hypertension Father   . Hypertension Mother   . Diabetes Father     No Known Allergies  Medication list has been reviewed and updated.  Current Outpatient Prescriptions on File Prior to Visit  Medication Sig Dispense Refill  . hydrochlorothiazide (HYDRODIURIL) 25 MG tablet Take 1 tablet (25 mg total) by mouth daily.  90 tablet  1  . telmisartan (MICARDIS) 80 MG tablet Take 1 tablet (80 mg total) by mouth daily.  90 tablet  1  . verapamil (CALAN-SR) 240 MG CR tablet TAKE 1 TABLET BY MOUTH AT BEDTIME  7 tablet   0  . ACIPHEX 20 MG tablet TAKE ONE TABLET BY MOUTH DAILY  30 each  3   No current facility-administered medications on file prior to visit.    Review of Systems:  As per HPI- otherwise negative.   Physical Examination: Filed Vitals:   04/21/13 1350  BP: 140/90  Pulse: 86  Temp: 98.2 F (36.8 C)  Resp: 16   Filed Vitals:   04/21/13 1350  Height: 6' 0.5" (1.842 m)  Weight: 189 lb (85.73 kg)   Body mass index is 25.27 kg/(m^2). Ideal Body Weight: Weight in (lb) to have BMI = 25: 186.5  GEN: WDWN, NAD, Non-toxic, A & O x 3, slim build HEENT: Atraumatic, Normocephalic. Neck supple. No masses, No LAD. Ears and Nose: No external deformity. CV: RRR, No M/G/R. No JVD. No thrill. No extra heart sounds. PULM: CTA B, no wheezes, crackles, rhonchi. No retractions. No resp. distress. No accessory muscle use. ABD: S, NT, ND, +BS. No rebound. No HSM. EXTR: No c/c/e NEURO Normal gait.  PSYCH: Normally interactive. Conversant. Not depressed or anxious appearing.  Calm demeanor.    Assessment and Plan: Essential hypertension, benign - Plan: hydrochlorothiazide (HYDRODIURIL) 25 MG tablet, telmisartan (MICARDIS) 80 MG tablet, verapamil (CALAN-SR) 240 MG CR tablet, Comprehensive metabolic  panel  Refilled his medications today.  Found some samples of micradis for him to use.   Refuses flu shot today Will plan further follow- up pending labs.   Signed Abbe Amsterdam, MD

## 2013-04-22 ENCOUNTER — Encounter: Payer: Self-pay | Admitting: Family Medicine

## 2013-06-01 ENCOUNTER — Emergency Department (HOSPITAL_COMMUNITY)
Admission: EM | Admit: 2013-06-01 | Discharge: 2013-06-01 | Disposition: A | Discharge status: Home / Self Care | Payer: BC Managed Care – PPO | Attending: Emergency Medicine | Admitting: Emergency Medicine

## 2013-06-01 ENCOUNTER — Encounter (HOSPITAL_COMMUNITY): Payer: Self-pay | Admitting: Emergency Medicine

## 2013-06-01 DIAGNOSIS — K529 Noninfective gastroenteritis and colitis, unspecified: Secondary | ICD-10-CM

## 2013-06-01 DIAGNOSIS — Z8659 Personal history of other mental and behavioral disorders: Secondary | ICD-10-CM | POA: Insufficient documentation

## 2013-06-01 DIAGNOSIS — K5289 Other specified noninfective gastroenteritis and colitis: Secondary | ICD-10-CM | POA: Insufficient documentation

## 2013-06-01 DIAGNOSIS — G43909 Migraine, unspecified, not intractable, without status migrainosus: Secondary | ICD-10-CM | POA: Insufficient documentation

## 2013-06-01 DIAGNOSIS — I10 Essential (primary) hypertension: Secondary | ICD-10-CM | POA: Insufficient documentation

## 2013-06-01 DIAGNOSIS — Z79899 Other long term (current) drug therapy: Secondary | ICD-10-CM | POA: Insufficient documentation

## 2013-06-01 DIAGNOSIS — I499 Cardiac arrhythmia, unspecified: Secondary | ICD-10-CM | POA: Insufficient documentation

## 2013-06-01 DIAGNOSIS — Z872 Personal history of diseases of the skin and subcutaneous tissue: Secondary | ICD-10-CM | POA: Insufficient documentation

## 2013-06-01 DIAGNOSIS — F17200 Nicotine dependence, unspecified, uncomplicated: Secondary | ICD-10-CM | POA: Insufficient documentation

## 2013-06-01 LAB — URINALYSIS, ROUTINE W REFLEX MICROSCOPIC
Bilirubin Urine: NEGATIVE
Hgb urine dipstick: NEGATIVE
Ketones, ur: NEGATIVE mg/dL
Protein, ur: NEGATIVE mg/dL
Urobilinogen, UA: 0.2 mg/dL (ref 0.0–1.0)
pH: 5.5 (ref 5.0–8.0)

## 2013-06-01 LAB — COMPREHENSIVE METABOLIC PANEL
ALT: 25 U/L (ref 0–53)
Albumin: 4.8 g/dL (ref 3.5–5.2)
Alkaline Phosphatase: 71 U/L (ref 39–117)
BUN: 20 mg/dL (ref 6–23)
Calcium: 10.2 mg/dL (ref 8.4–10.5)
Creatinine, Ser: 1.2 mg/dL (ref 0.50–1.35)
GFR calc Af Amer: 86 mL/min — ABNORMAL LOW (ref >90)
Glucose, Bld: 122 mg/dL — ABNORMAL HIGH (ref 70–99)
Potassium: 4.7 mEq/L (ref 3.5–5.1)
Total Protein: 8.6 g/dL — ABNORMAL HIGH (ref 6.0–8.3)

## 2013-06-01 LAB — CBC WITH DIFFERENTIAL/PLATELET
Basophils Absolute: 0 10*3/uL (ref 0.0–0.1)
Eosinophils Relative: 1 % (ref 0–5)
HCT: 45.9 % (ref 39.0–52.0)
Lymphocytes Relative: 7 % — ABNORMAL LOW (ref 12–46)
MCHC: 35.1 g/dL (ref 30.0–36.0)
Monocytes Absolute: 0.5 10*3/uL (ref 0.1–1.0)
Neutro Abs: 6.6 10*3/uL (ref 1.7–7.7)
Neutrophils Relative %: 86 % — ABNORMAL HIGH (ref 43–77)
RBC: 6.02 MIL/uL — ABNORMAL HIGH (ref 4.22–5.81)
RDW: 14.6 % (ref 11.5–15.5)
WBC: 7.7 10*3/uL (ref 4.0–10.5)

## 2013-06-01 LAB — URINE MICROSCOPIC-ADD ON

## 2013-06-01 MED ORDER — ONDANSETRON 4 MG PO TBDP: 8.0000 mg | ORAL_TABLET | Freq: Once | ORAL | Status: DC

## 2013-06-01 MED ORDER — ONDANSETRON 8 MG PO TBDP: ORAL_TABLET | ORAL | Status: DC

## 2013-06-01 MED ORDER — SODIUM CHLORIDE 0.9 % IV BOLUS (SEPSIS)
1000.0000 mL | Freq: Once | INTRAVENOUS | Status: AC
  Administered 2013-06-01: 1000 mL via INTRAVENOUS

## 2013-06-01 MED ORDER — ONDANSETRON HCL 4 MG/2ML IJ SOLN
4.0000 mg | Freq: Once | INTRAMUSCULAR | Status: AC
  Administered 2013-06-01: 4 mg via INTRAVENOUS
  Filled 2013-06-01: qty 2

## 2013-06-01 NOTE — ED Notes (Addendum)
Pt c/o abdominal pain with vomiting onset last night. Pt ate hot wings from a restaurant last night. Visitor reports that pt vomited blood x 1 on way here to ED. Pt actively vomiting in triage.

## 2013-06-01 NOTE — ED Provider Notes (Signed)
[name]: [id number]     Arrival date & time 06/01/13  1821 History   First MD Initiated Contact with Patient 06/01/13 1942     Chief Complaint  Patient presents with  . Abdominal Pain  . Emesis   (Consider location/radiation/quality/duration/timing/severity/associated sxs/prior Treatment) Patient is a 42 y.o. male presenting with abdominal pain and vomiting. The history is provided by the patient and medical records. No language interpreter was used.  Abdominal Pain Associated symptoms: diarrhea, nausea and vomiting   Associated symptoms: no chest pain, no constipation, no cough, no dysuria, no fatigue, no fever, no hematuria and no shortness of breath   Emesis Associated symptoms: abdominal pain and diarrhea    BLADIMIR [name] is a 42 y.o. male  with a hx of hypertension, migraine headache, anxiety, GERD, irregular heart rate presents to the Emergency Department complaining of gradual, persistent, progressively worsening nausea and vomiting onset 3 a.m. after eating hot wings for dinner last night.  Patient states he ate between 8 and 12 hot wings last night and awoke several hours later with diarrhea. He then developed associated epigastric cramping nausea and vomiting. Patient reports intermittent abdominal cramping which alleviates after emesis the returns just prior to new bouts of emesis. Patient reports trying every type of soda he owns without relief. Nothing seems to make the symptoms better or worse..  Pt denies fever, chills, headache, neck pain, chest pain, shortness of breath, weakness, dizziness, syncope, dysuria, hematuria, denies testicular pain, penile pain or discharge..      Past Medical History  Diagnosis Date  . Hypertension   . Migraine   . GERD (gastroesophageal reflux disease)   . Anxiety   . Facial numbness 2004    ?small vessel disease; (-) hyper-coag profile  . ANXIETY 06/03/2007  . GERD 06/03/2007  . HYPERTENSION 11/22/2006  . MIGRAINE HEADACHE 11/22/2006  . Rash  10/18/2010   Past Surgical History  Procedure Laterality Date  . Appendectomy     Family History  Problem Relation Age of Onset  . Hypertension Father   . Hypertension Mother   . Diabetes Father    History  Substance Use Topics  . Smoking status: Current Every Day Smoker -- 0.25 packs/day    Types: Cigarettes  . Smokeless tobacco: Not on file  . Alcohol Use: Yes     Comment: socially    Review of Systems  Constitutional: Negative for fever, diaphoresis, appetite change, fatigue and unexpected weight change.  HENT: Negative for mouth sores and trouble swallowing.   Respiratory: Negative for cough, chest tightness, shortness of breath, wheezing and stridor.   Cardiovascular: Negative for chest pain and palpitations.  Gastrointestinal: Positive for nausea, vomiting, abdominal pain and diarrhea. Negative for constipation, blood in stool, abdominal distention and rectal pain.  Genitourinary: Negative for dysuria, urgency, frequency, hematuria, flank pain and difficulty urinating.  Musculoskeletal: Negative for back pain, neck pain and neck stiffness.  Skin: Negative for rash.  Allergic/Immunologic: Negative for immunocompromised state.  Neurological: Negative for weakness.  Hematological: Negative for adenopathy.  Psychiatric/Behavioral: Negative for confusion.  All other systems reviewed and are negative.    Allergies  Review of patient's allergies indicates no known allergies.  Home Medications   Current Outpatient Rx  Name  Route  Sig  Dispense  Refill  . bismuth subsalicylate (PEPTO BISMOL) 262 MG/15ML suspension   Oral   Take 30 mLs by mouth every 6 (six) hours as needed for diarrhea or loose stools.         Marland Kitchen  hydrochlorothiazide (HYDRODIURIL) 25 MG tablet   Oral   Take 1 tablet (25 mg total) by mouth daily. May convert to 90 day supply if pt prefers   30 tablet   11   . telmisartan (MICARDIS) 80 MG tablet   Oral   Take 1 tablet (80 mg total) by mouth daily.  May conver to 90 day supply if pt prefers   30 tablet   11   . verapamil (CALAN-SR) 240 MG CR tablet   Oral   Take 240 mg by mouth daily.         . ondansetron (ZOFRAN ODT) 8 MG disintegrating tablet      8mg  ODT q4 hours prn nausea   4 tablet   0    BP 152/95  Pulse 92  Temp(Src) 98.5 F (36.9 C) (Oral)  Resp 16  Wt 185 lb 1.6 oz (83.961 kg)  SpO2 99% Physical Exam  Nursing note and vitals reviewed. Constitutional: He is oriented to person, place, and time. He appears well-developed and well-nourished. No distress.  Awake, alert, nontoxic appearance  HENT:  Head: Normocephalic and atraumatic.  Mouth/Throat: Oropharynx is clear and moist. No oropharyngeal exudate.  Eyes: Conjunctivae are normal. Pupils are equal, round, and reactive to light. No scleral icterus.  Neck: Normal range of motion. Neck supple.  Cardiovascular: Normal rate, normal heart sounds and intact distal pulses.   No murmur heard. Irregular rhythm  Pulmonary/Chest: Effort normal and breath sounds normal. No respiratory distress. He has no wheezes. He has no rales.  Clear and equal breath sounds  Abdominal: Soft. Bowel sounds are normal. He exhibits no distension and no mass. There is no tenderness. There is no rebound and no guarding.  Abdomen soft and nontender No rebound, or peritoneal signs  Musculoskeletal: Normal range of motion. He exhibits no edema.  Lymphadenopathy:    He has no cervical adenopathy.  Neurological: He is alert and oriented to person, place, and time. He exhibits normal muscle tone. Coordination normal.  Speech is clear and goal oriented Moves extremities without ataxia  Skin: Skin is warm and dry. He is not diaphoretic. No erythema.  Psychiatric: He has a normal mood and affect.    ED Course  Procedures (including critical care time) Labs Review Labs Reviewed  URINALYSIS, ROUTINE W REFLEX MICROSCOPIC - Abnormal; Notable for the following:    Leukocytes, UA TRACE (*)     All other components within normal limits  COMPREHENSIVE METABOLIC PANEL - Abnormal; Notable for the following:    Glucose, Bld 122 (*)    Total Protein 8.6 (*)    GFR calc non Af Amer 74 (*)    GFR calc Af Amer 86 (*)    All other components within normal limits  CBC WITH DIFFERENTIAL - Abnormal; Notable for the following:    RBC 6.02 (*)    MCV 76.2 (*)    Neutrophils Relative % 86 (*)    Lymphocytes Relative 7 (*)    Lymphs Abs 0.5 (*)    All other components within normal limits  URINE MICROSCOPIC-ADD ON   Imaging Review No results found.  EKG Interpretation   None       MDM   1. Gastroenteritis     Waldemar Dickens presents with nausea, vomiting and diarrhea after hot wing ingestion. Patient given fluids. Basic labs reassuring. Urinalysis evidence of urinary tract infection. Patient reports he is thirsty. Will by mouth trial with ice and then fluids. The patient passes  by mouth trial we'll discharge home. Patient is alert, oriented, nontoxic and nonseptic appearing. Patient is afebrile and not tachycardic. On exam he has irregular heart rate which she reports is normal for him. He takes for Verapamil for this.  Patient has been evaluated by [facility] cardiology in the past, Dr. Drue Novel, but his primary care currently is with urgent medical and family care.  9:22 PM Patient by mouth trial without difficulty. No further episodes of emesis.  Patient with symptoms consistent with viral gastroenteritis.  Vitals are stable, no fever.  No signs of dehydration, tolerating PO fluids > 6 oz.  Lungs are clear.  No focal abdominal pain, no concern for appendicitis, cholecystitis, pancreatitis, ruptured viscus, UTI, kidney stone, or any other abdominal etiology.  Supportive therapy indicated with return if symptoms worsen.  Patient counseled.\  It has been determined that no acute conditions requiring further emergency intervention are present at this time. The patient/guardian have been  advised of the diagnosis and plan. We have discussed signs and symptoms that warrant return to the ED, such as changes or worsening in symptoms.   Vital signs are stable at discharge.   BP 152/95  Pulse 92  Temp(Src) 98.5 F (36.9 C) (Oral)  Resp 16  Wt 185 lb 1.6 oz (83.961 kg)  SpO2 99%  Patient/guardian has voiced understanding and agreed to follow-up with the PCP or specialist.       Dierdre Forth, PA-C 06/01/13 2123

## 2013-06-03 NOTE — ED Provider Notes (Signed)
Medical screening examination/treatment/procedure(s) were performed by non-physician practitioner and as supervising physician I was immediately available for consultation/collaboration.  EKG Interpretation   None         Richardean Canal, MD 06/03/13 [phone]

## 2014-04-14 ENCOUNTER — Other Ambulatory Visit: Payer: Self-pay | Admitting: Family Medicine

## 2014-04-28 ENCOUNTER — Other Ambulatory Visit: Payer: Self-pay | Admitting: Family Medicine

## 2014-05-13 ENCOUNTER — Other Ambulatory Visit: Payer: Self-pay | Admitting: Physician Assistant

## 2014-05-27 ENCOUNTER — Other Ambulatory Visit: Payer: Self-pay | Admitting: Family Medicine

## 2014-05-29 ENCOUNTER — Other Ambulatory Visit: Payer: Self-pay | Admitting: Physician Assistant

## 2014-06-20 ENCOUNTER — Other Ambulatory Visit: Payer: Self-pay | Admitting: Physician Assistant

## 2014-06-20 ENCOUNTER — Telehealth: Payer: Self-pay

## 2014-06-20 ENCOUNTER — Other Ambulatory Visit: Payer: Self-pay | Admitting: Family Medicine

## 2014-06-20 DIAGNOSIS — I10 Essential (primary) hypertension: Secondary | ICD-10-CM

## 2014-06-20 MED ORDER — TELMISARTAN 80 MG PO TABS: 80.0000 mg | ORAL_TABLET | Freq: Every day | ORAL | Status: DC

## 2014-06-20 MED ORDER — HYDROCHLOROTHIAZIDE 25 MG PO TABS: 25.0000 mg | ORAL_TABLET | Freq: Every day | ORAL | Status: DC

## 2014-06-20 NOTE — Telephone Encounter (Signed)
Patient called again. He is going on the road tonight he states.

## 2014-06-20 NOTE — Telephone Encounter (Signed)
The patient called to ask for partial refill of his medications of hydrochlorothiazide (HYDRODIURIL) 25 MG tablet and verapamil (CALAN-SR) 240 MG CR tablet.  He said that the pharmacy informed him that the medications could not be refilled without an office visit, so his refill requests were denied.  He has an appointment on 06/29/14 with Dr. Patsy Lager, but he said that he is out of his medication now and would like a partial refill until that date.  Please advise.  Thank you.  CB#: [phone]

## 2014-06-20 NOTE — Telephone Encounter (Signed)
Spoke with him on the phone.  He called concerned that he is out of his BP medication and is about to go on a short trip.  He was last seen here in 04/2013 and has been asked numerous times to come in for a recheck.  He states that he has an appt for 06/29/2014.  I agreed to give him #15 of each requested BP medication.  He knows that after this I will not be able to continue to fill medication for him  Meds ordered this encounter  Medications  . verapamil (CALAN-SR) 240 MG CR tablet    Sig: TAKE 1 TABLET BY MOUTH AT BEDTIME. NO MORE REFILLS WITHOUT OFFICE VISIT 3RD NOTICE    Dispense:  15 tablet    Refill:  0  . hydrochlorothiazide (HYDRODIURIL) 25 MG tablet    Sig: Take 1 tablet (25 mg total) by mouth daily.    Dispense:  15 tablet    Refill:  0  . telmisartan (MICARDIS) 80 MG tablet    Sig: Take 1 tablet (80 mg total) by mouth daily.    Dispense:  15 tablet    Refill:  0

## 2014-06-22 NOTE — Telephone Encounter (Signed)
#  15 sent on 1/2 for pt.

## 2014-06-29 ENCOUNTER — Ambulatory Visit: Payer: BC Managed Care – PPO | Admitting: Family Medicine

## 2014-07-02 ENCOUNTER — Ambulatory Visit (INDEPENDENT_AMBULATORY_CARE_PROVIDER_SITE_OTHER): Payer: BLUE CROSS/BLUE SHIELD | Admitting: Sports Medicine

## 2014-07-02 VITALS — BP 132/88 | HR 66 | Temp 98.3°F | Resp 16 | Ht 71.5 in | Wt 211.0 lb

## 2014-07-02 DIAGNOSIS — R739 Hyperglycemia, unspecified: Secondary | ICD-10-CM

## 2014-07-02 DIAGNOSIS — S0083XA Contusion of other part of head, initial encounter: Secondary | ICD-10-CM

## 2014-07-02 DIAGNOSIS — I10 Essential (primary) hypertension: Secondary | ICD-10-CM

## 2014-07-02 MED ORDER — HYDROCHLOROTHIAZIDE 25 MG PO TABS: 25.0000 mg | ORAL_TABLET | Freq: Every day | ORAL | Status: DC

## 2014-07-02 MED ORDER — TELMISARTAN 80 MG PO TABS: 80.0000 mg | ORAL_TABLET | Freq: Every day | ORAL | Status: DC

## 2014-07-02 MED ORDER — VERAPAMIL HCL ER 240 MG PO TBCR: 240.0000 mg | EXTENDED_RELEASE_TABLET | Freq: Every day | ORAL | Status: DC

## 2014-07-02 NOTE — Progress Notes (Signed)
  Joshua Brunsonald L Ammons Jr. - 44 y.o. male MRN [id number]  Date of birth: 06/24/1970  [name] is here for: Chief Complaint  Patient presents with  . Medication Refill    HCTZ, Verapamil & Micardis  . Jaw Pain   CC: HTN Patient denies any chest pain, shortness of breath, tachycardia palpitations, unilateral weakness, slurring of speech changes in vision. Denies any orthostasis, orthopnea or PND.  Reports being compliant with his medications and no known medication intolerances. He continues to smoke approximately half a pack to 1 pack per day. He is interested in potentially quitting but not at this time.  Secondary Complaints:  Left jaw pain Patient reports jaw pain since 2 weeks ago when he fell forward and struck his chin on a door. He reports some clicking and popping within the left joint with no locking. He has not tried taking any medications. Denies any significant swelling or bruising.  ROS:  Per HPI.   HISTORY: Past Medical, Surgical, Social, and Family History Reviewed & Updated per EMR.  Pertinent Historical Findings include:  reports that he has been smoking Cigarettes.  He has been smoking about 0.25 packs per day. He has never used smokeless tobacco. History of anxiety, reflux, migraines and hypertension Declines influenza vaccine today.  OBJECTIVE:  VS:   HT:5' 11.5" (181.6 cm)   WT:211 lb (95.709 kg)  BMI:29.1          BP:132/88 mmHg  HR:66bpm  TEMP:98.3 F (36.8 C)(Oral)  RESP:99 %  PHYSICAL EXAM: GENERAL:  adult African-American male. In no discomfort; no respiratory distress   PSYCH: alert and appropriate, good insight  No significant tenderness palpation with palpable crepitation over TMJ. No malocclusion.   HNEENT: mmm, no JVD  CARDIAC: RRR, S1/S2 heard, no murmur  LUNGS: CTA B, no wheezes, no crackles  EXTREM: Warm, well perfused.  Moves all 4 extremities spontaneously; no lateralization.  Pedal pulses adult/4.  No significant pretibial edema.   EKG: 07/02/13 -  Reviewed by Drs. Berline Choughigby & Smith Rate & Rhythm: 65 NSR  Axis:  normal  Intervals:  normal  Other: Normal EKG, no SVT, no ischemia   ASSESSMENT: 1. Essential hypertension   2. Elevated serum glucose   3. TMJ contusion, initial encounter    No problems updated.  EKG reassuring. No evidence of recurrent SVT or interval prolongation   PLAN: See problem based charting & AVS for additional documentation.  Refill medications, well controlled today.  Risk stratification labs obtained  Discussed using anti-inflammatories OTC and gentle neck stretching exercises for TMJ. If not improving consider imaging versus referral to dentistry.  Encouraged smoking cessation  Recommended TLC > Return in about 2 months (around 08/31/2014).

## 2014-07-02 NOTE — Patient Instructions (Signed)
Take 2 aleeve (naproxen) for 7-10 days twice per day for your jaw. Try streching your neck with a towel 2-3 times per day  Temporomandibular Problems  Temporomandibular joint (TMJ) dysfunction means there are problems with the joint between your jaw and your skull. This is a joint lined by cartilage like other joints in your body but also has a small disc in the joint which keeps the bones from rubbing on each other. These joints are like other joints and can get inflamed (sore) from arthritis and other problems. When this joint gets sore, it can cause headaches and pain in the jaw and the face. CAUSES  Usually the arthritic types of problems are caused by soreness in the joint. Soreness in the joint can also be caused by overuse. This may come from grinding your teeth. It may also come from mis-alignment in the joint. DIAGNOSIS Diagnosis of this condition can often be made by history and exam. Sometimes your caregiver may need X-rays or an MRI scan to determine the exact cause. It may be necessary to see your dentist to determine if your teeth and jaws are lined up correctly. TREATMENT  Most of the time this problem is not serious; however, sometimes it can persist (become chronic). When this happens medications that will cut down on inflammation (soreness) help. Sometimes a shot of cortisone into the joint will be helpful. If your teeth are not aligned it may help for your dentist to make a splint for your mouth that can help this problem. If no physical problems can be found, the problem may come from tension. If tension is found to be the cause, biofeedback or relaxation techniques may be helpful. HOME CARE INSTRUCTIONS  1. Later in the day, applications of ice packs may be helpful. Ice can be used in a plastic bag with a towel around it to prevent frostbite to skin. This may be used about every 2 hours for 20 to 30 minutes, as needed while awake, or as directed by your caregiver. 2. Only take  over-the-counter or prescription medicines for pain, discomfort, or fever as directed by your caregiver. 3. If physical therapy was prescribed, follow your caregiver's directions. 4. Wear mouth appliances as directed if they were given. Document Released: 02/28/2001 Document Revised: 08/28/2011 Document Reviewed: 06/07/2008 [facility] Patient Information 2015 [place], [place]. This information is not intended to replace advice given to you by your health care provider. Make sure you discuss any questions you have with your health care provider.  Here are some basic exercise recommendations to remember:  Try to be active every day and throughout the day.    We have actually found that being active throughout the day is likely more important than getting to the gym 5 days per week.  Minimizing being in active should be an important health goal we all are working towards.  A basic starting point can be limiting the time that you sit or lay still during the day.  You should sit/lay/lounge for no longer than 20-30 minutes at a time if you are able.  Even interrupting sitting with standing/jumping jacks/dancing/etc for one minute can have significant health benefits.   Try to remember: "Why sit when you can stand, why stand when you can walk, why walk when you can run."  The point he is to look for opportunities during the day where you can increase your heart rate.    Ideally, I recommend you exercise for at least 30 minutes per day, 5  days per week.  This would involve any activity that will elevate your heart rate to the point that you have a hard time carrying on a normal conversation, but not to the point of being completely out of breath.  There are alternative options however this is a generally good goal to strive for.  You can adjust your intensity based on heart rate (HR).  To calculate your target HR take 220 minus your age, then multiply X 0.7 (70%).  Example for 42 year old:  220 - 40 = 180;   180 X 0.7 = 126  Try to keep your HR within 10 beats of this target throughout your exercise  Here are some basic nutrition rules to remember:  "Eat Real Foods & Drink Real Drinks" - if you think it was made in a factory . . it is best to avoid as a staple in your diet.  Limiting processed  foods to 1-2 times per week is a good idea.  Food that came from the ground, from a tree, from a plant or is a plant, is food that you can essentially eat as much of it he would like as long as it looks like it did when it came from that place!!!  I've never seen a french fry come out of the ground!  Obviously everything should be eaten in moderation but this can be generally applied.   Limit your salt intake (in general aim for less than  per day). Sticking with fresh fruits and vegetables as well as home cooked meals will typically provide more nutrition and less salt than prepackaged meals.     Limit the amount of sugar sweetened and artificially sweetened foods and beverages.  Avoid soda, juices and generally any bottled beverage is a good idea.  Sticking with water flavored with a slice of lemon, lime or orange is a great option if you want something with flavor in it.  Using flavored seltzer water to flavor plain water will also add some bite if you want something more than flavor.      Eat at least 3 meals and 1-2 snacks per day.  Aim for no more than 5 hours between eating.  This will actually increase your metabolism and help prevent you from overeating.   Here are 2 of my favorite web sites that provide great nutrition and exercise advice.   www.eatsmartmovemore[place].com https://wilkins.info/   Check out the DASH diet  1.5 Gram Low Sodium Diet  A 1.5 gram sodium diet restricts the amount of sodium in the diet to no more than 1.5 g or 1500 mg daily. The American Heart Association recommends Americans over the age of 62 to consume no more than 1500 mg of sodium each day to reduce the risk of  developing high blood pressure. Research also shows that limiting sodium may reduce heart attack and stroke risk. Many foods contain sodium for flavor and sometimes as a preservative. When the amount of sodium in a diet needs to be low, it is important to know what to look for when choosing foods and drinks. The following includes some information and guidelines to help make it easier for you to adapt to a low sodium diet.  QUICK TIPS  Do not add salt to food.  Avoid convenience items and fast food.  Choose unsalted snack foods.  Buy lower sodium products, often labeled as "lower sodium" or "no salt added."  Check food labels to learn how much sodium is in  1 serving.  When eating at a restaurant, ask that your food be prepared with less salt or none, if possible.  READING FOOD LABELS FOR SODIUM INFORMATION  The nutrition facts label is a good place to find how much sodium is in foods. Look for products with no more than 400 mg of sodium per serving.  Remember that 1.5 g = 1500 mg.  The food label may also list foods as:  Sodium-free: Less than 5 mg in a serving.  Very low sodium: 35 mg or less in a serving.  Low-sodium: 140 mg or less in a serving.  Light in sodium: 50% less sodium in a serving. For example, if a food that usually has 300 mg of sodium is changed to become light in sodium, it will have 150 mg of sodium.  Reduced sodium: 25% less sodium in a serving. For example, if a food that usually has 400 mg of sodium is changed to reduced sodium, it will have 300 mg of sodium.  CHOOSING FOODS  Grains  Avoid: Salted crackers and snack items. Some cereals, including instant hot cereals. Bread stuffing and biscuit mixes. Seasoned rice or pasta mixes.  Choose: Unsalted snack items. Low-sodium cereals, oats, puffed wheat and rice, shredded wheat. English muffins and bread. Pasta.  Meats  Avoid: Salted, canned, smoked, spiced, pickled meats, including fish and poultry. Bacon, ham, sausage, cold  cuts, hot dogs, anchovies.  Choose: Low-sodium canned tuna and salmon. Fresh or frozen meat, poultry, and fish.  Dairy  Avoid: Processed cheese and spreads. Cottage cheese. Buttermilk and condensed milk. Regular cheese.  Choose: Milk. Low-sodium cottage cheese. Yogurt. Sour cream. Low-sodium cheese.  Fruits and Vegetables  Avoid: Regular canned vegetables. Regular canned tomato sauce and paste. Frozen vegetables in sauces. Olives. Rosita FirePickles. Relishes. Sauerkraut.  Choose: Low-sodium canned vegetables. Low-sodium tomato sauce and paste. Frozen or fresh vegetables. Fresh and frozen fruit.  Condiments  Avoid: Canned and packaged gravies. Worcestershire sauce. Tartar sauce. Barbecue sauce. Soy sauce. Steak sauce. Ketchup. Onion, garlic, and table salt. Meat flavorings and tenderizers.  Choose: Fresh and dried herbs and spices. Low-sodium varieties of mustard and ketchup. Lemon juice. Tabasco sauce. Horseradish.  SAMPLE 1.5 GRAM SODIUM MEAL PLAN  Breakfast / Sodium (mg)  1 cup low-fat milk / 143 mg  1 whole-wheat English muffin / 240 mg  1 tbs heart-healthy margarine / 153 mg  1 hard-boiled egg / 139 mg  1 small orange / 0 mg  Lunch / Sodium (mg)  1 cup raw carrots / 76 mg  2 tbs no salt added peanut butter / 5 mg  2 slices whole-wheat bread / 270 mg  1 tbs jelly / 6 mg   cup red grapes / 2 mg  Dinner / Sodium (mg)  1 cup whole-wheat pasta / 2 mg  1 cup low-sodium tomato sauce / 73 mg  3 oz lean ground beef / 57 mg  1 small side salad (1 cup raw spinach leaves,  cup cucumber,  cup yellow bell pepper) with 1 tsp olive oil and 1 tsp red wine vinegar / 25 mg  Snack / Sodium (mg)  1 container low-fat vanilla yogurt / 107 mg  3 graham cracker squares / 127 mg  Nutrient Analysis  Calories: 1745  Protein: 75 g  Carbohydrate: 237 g  Fat: 57 g  Sodium: 1425 mg  Document Released: 06/05/2005 Document Revised: 02/15/2011 Document Reviewed: 09/06/2009  [facility] Patient Information 2012  [place] [place].

## 2014-07-03 LAB — COMPREHENSIVE METABOLIC PANEL
ALT: 31 U/L (ref 0–53)
AST: 18 U/L (ref 0–37)
Albumin: 4.3 g/dL (ref 3.5–5.2)
Alkaline Phosphatase: 54 U/L (ref 39–117)
BILIRUBIN TOTAL: 0.5 mg/dL (ref 0.2–1.2)
BUN: 17 mg/dL (ref 6–23)
CALCIUM: 9.5 mg/dL (ref 8.4–10.5)
CO2: 26 mEq/L (ref 19–32)
CREATININE: 1.2 mg/dL (ref 0.50–1.35)
Chloride: 103 mEq/L (ref 96–112)
Glucose, Bld: 83 mg/dL (ref 70–99)
Potassium: 4.3 mEq/L (ref 3.5–5.3)
Sodium: 138 mEq/L (ref 135–145)
Total Protein: 7.1 g/dL (ref 6.0–8.3)

## 2014-07-03 LAB — TSH: TSH: 1.301 u[IU]/mL (ref 0.350–4.500)

## 2014-07-03 LAB — LDL CHOLESTEROL, DIRECT: LDL DIRECT: 74 mg/dL

## 2014-07-03 LAB — HEMOGLOBIN A1C
Hgb A1c MFr Bld: 6.2 % — ABNORMAL HIGH (ref <5.7)
Mean Plasma Glucose: 131 mg/dL — ABNORMAL HIGH (ref <117)

## 2014-09-13 ENCOUNTER — Ambulatory Visit (INDEPENDENT_AMBULATORY_CARE_PROVIDER_SITE_OTHER): Payer: BLUE CROSS/BLUE SHIELD | Admitting: Family Medicine

## 2014-09-13 ENCOUNTER — Ambulatory Visit (INDEPENDENT_AMBULATORY_CARE_PROVIDER_SITE_OTHER): Payer: BLUE CROSS/BLUE SHIELD

## 2014-09-13 VITALS — BP 131/80 | HR 79 | Temp 97.9°F | Resp 20 | Ht 71.75 in | Wt 208.4 lb

## 2014-09-13 DIAGNOSIS — S61309A Unspecified open wound of unspecified finger with damage to nail, initial encounter: Secondary | ICD-10-CM | POA: Diagnosis not present

## 2014-09-13 DIAGNOSIS — S6991XA Unspecified injury of right wrist, hand and finger(s), initial encounter: Secondary | ICD-10-CM | POA: Diagnosis not present

## 2014-09-13 NOTE — Progress Notes (Signed)
° °  Subjective:    Patient ID: Joshua Brunsonald L Forni Jr., male    DOB: 03/05/1971, 44 y.o.   MRN: [id number] This chart was scribed for Elvina SidleKurt Lauenstein, MD by Jolene Provostobert Halas, Medical Scribe. This patient was seen in Room 2 and the patient's care was started a 1:58 PM.  Chief Complaint  Patient presents with   Finger Injury    Right Middle Finger-Slammed in Car Door last night    HPI HPI Comments: Joshua BrunsRonald L Quickel Jr. is a 44 y.o. male [facility] injury to his right middle finger that happened last night when he slammed his finger in the car door.  Pt works in [employer] as a Therapist, nutritionalloader operator.   Review of Systems  Constitutional: Negative for fever and chills.  Skin: Positive for color change and wound.  Neurological: Negative for weakness and numbness.       Objective:   Physical Exam  Constitutional: He is oriented to person, place, and time. He appears well-developed and well-nourished. No distress.  HENT:  Head: Normocephalic and atraumatic.  Eyes: Pupils are equal, round, and reactive to light.  Neck: Neck supple.  Cardiovascular: Normal rate.   Pulmonary/Chest: Effort normal. No respiratory distress.  Musculoskeletal: Normal range of motion. He exhibits edema and tenderness.  Entire nail avulsed on right ring finger. Distal phalanx with edema and ecchymosis.  Neurological: He is alert and oriented to person, place, and time. Coordination normal.  Skin: Skin is warm and dry. He is not diaphoretic.  Psychiatric: He has a normal mood and affect. His behavior is normal.  Nursing note and vitals reviewed.  [facility] reading (PRIMARY) by Dr. Milus GlazierLauenstein, x-ray negative.     Assessment & Plan:   Right middle finger nail avulsion. Patient prefers no pain medicine.  Patient instructed how to protect the nail. He feels he can continue to work this week without taking time off.  This chart was scribed in my presence and reviewed by me personally.    ICD-9-CM  ICD-10-CM   1. Finger injury, right, initial encounter 959.5 S69.91XA DG Finger Middle Right  2. Nail avulsion, finger, initial encounter 883.0 S61.309A      Signed, Elvina SidleKurt Lauenstein, MD

## 2015-01-04 ENCOUNTER — Telehealth: Payer: Self-pay

## 2015-01-04 DIAGNOSIS — I10 Essential (primary) hypertension: Secondary | ICD-10-CM

## 2015-01-04 MED ORDER — HYDROCHLOROTHIAZIDE 25 MG PO TABS: 25.0000 mg | ORAL_TABLET | Freq: Every day | ORAL | Status: DC

## 2015-01-04 MED ORDER — TELMISARTAN 80 MG PO TABS: 80.0000 mg | ORAL_TABLET | Freq: Every day | ORAL | Status: DC

## 2015-01-04 MED ORDER — VERAPAMIL HCL ER 240 MG PO TBCR: 240.0000 mg | EXTENDED_RELEASE_TABLET | Freq: Every day | ORAL | Status: DC

## 2015-01-04 NOTE — Telephone Encounter (Signed)
[facility] [address] [facility] pt. Please see previous message.

## 2015-01-04 NOTE — Telephone Encounter (Signed)
Rxs sent

## 2015-01-04 NOTE — Telephone Encounter (Signed)
Pt says his pharmacy told him they've tried asking us for his refills 3 different times.  He needs refills on his hydrochlorothiazide; micardis; and verapamil.  Call him at [phone]

## 2015-01-31 ENCOUNTER — Other Ambulatory Visit: Payer: Self-pay | Admitting: Family Medicine

## 2015-02-02 ENCOUNTER — Other Ambulatory Visit: Payer: Self-pay | Admitting: Family Medicine

## 2015-02-08 ENCOUNTER — Telehealth: Payer: Self-pay

## 2015-02-08 ENCOUNTER — Other Ambulatory Visit: Payer: Self-pay | Admitting: Family Medicine

## 2015-02-08 NOTE — Telephone Encounter (Signed)
Patient needs refill on Micardis and Verapamil. He states that he knows he needs to come in for an OV however he does not have the funds and does not get paid until next Friday. Can he have enough pills to get him through to next week? [facility].  [phone]

## 2015-02-10 MED ORDER — TELMISARTAN 80 MG PO TABS: ORAL_TABLET | ORAL | Status: DC

## 2015-02-10 MED ORDER — VERAPAMIL HCL ER 240 MG PO TBCR: EXTENDED_RELEASE_TABLET | ORAL | Status: DC

## 2015-02-10 NOTE — Telephone Encounter (Signed)
I sent refills to his pharmacy.

## 2015-03-04 ENCOUNTER — Ambulatory Visit (INDEPENDENT_AMBULATORY_CARE_PROVIDER_SITE_OTHER): Payer: BLUE CROSS/BLUE SHIELD | Admitting: Emergency Medicine

## 2015-03-04 VITALS — BP 128/70 | HR 74 | Temp 98.1°F | Resp 16 | Ht 72.0 in | Wt 212.4 lb

## 2015-03-04 DIAGNOSIS — I10 Essential (primary) hypertension: Secondary | ICD-10-CM | POA: Diagnosis not present

## 2015-03-04 MED ORDER — TELMISARTAN 80 MG PO TABS: ORAL_TABLET | ORAL | Status: DC

## 2015-03-04 MED ORDER — VERAPAMIL HCL ER 240 MG PO TBCR: EXTENDED_RELEASE_TABLET | ORAL | Status: DC

## 2015-03-04 MED ORDER — HYDROCHLOROTHIAZIDE 25 MG PO TABS
25.0000 mg | ORAL_TABLET | Freq: Every day | ORAL | Status: DC
Start: 2015-03-04 — End: 2015-08-24

## 2015-03-04 NOTE — Patient Instructions (Signed)

## 2015-03-04 NOTE — Progress Notes (Signed)
Subjective:  Patient ID: [name]., male    DOB: 08-09-1970  Age: 44 y.o. MRN: [id number]  CC: Medication Refill   HPI [name]. presents  with request for refill on his antihypertensives. Tolerating the medication well and has no adverse side effects and no evidence of end organ dysfunction. He denies any other acute complaints.  History Waldron has a past medical history of Hypertension; Migraine; GERD (gastroesophageal reflux disease); Anxiety; Facial numbness (2004); ANXIETY (06/03/2007); GERD (06/03/2007); HYPERTENSION (11/22/2006); MIGRAINE HEADACHE (11/22/2006); and Rash (10/18/2010).   He has past surgical history that includes Appendectomy.   His  family history includes Diabetes in his father; Hypertension in his father and mother.  He   reports that he has been smoking Cigarettes.  He has been smoking about 0.25 packs per day. He has never used smokeless tobacco. He reports that he drinks alcohol. He reports that he does not use illicit drugs.  Outpatient Prescriptions Prior to Visit  Medication Sig Dispense Refill  . hydrochlorothiazide (HYDRODIURIL) 25 MG tablet Take 1 tablet (25 mg total) by mouth daily. PATIENT NEEDS OFFICE VISIT FOR ADDITIONAL REFILLS 30 tablet 0  . telmisartan (MICARDIS) 80 MG tablet TAKE 1 TABLET (80 MG) BY MOUTH DAILY.  "OV NEEDED FOR REFILLS" 30 tablet 0  . verapamil (CALAN-SR) 240 MG CR tablet TAKE 1 TABLET (240 MG) BY MOUTH AT BEDTIME.  "OV NEEDED" 30 tablet 0   No facility-administered medications prior to visit.    Social History   Social History  . Marital Status: Single    Spouse Name: N/A  . Number of Children: N/A  . Years of Education: N/A   Social History Main Topics  . Smoking status: Current Every Day Smoker -- 0.25 packs/day    Types: Cigarettes  . Smokeless tobacco: Never Used  . Alcohol Use: 0.0 oz/week    0 Standard drinks or equivalent per week     Comment: socially  . Drug Use: No  . Sexual Activity:  Not Asked   Other Topics Concern  . None   Social History Narrative     Review of Systems  Constitutional: Negative for fever, chills and appetite change.  HENT: Negative for congestion, ear pain, postnasal drip, sinus pressure and sore throat.   Eyes: Negative for pain and redness.  Respiratory: Negative for cough, shortness of breath and wheezing.   Cardiovascular: Negative for leg swelling.  Gastrointestinal: Negative for nausea, vomiting, abdominal pain, diarrhea, constipation and blood in stool.  Endocrine: Negative for polyuria.  Genitourinary: Negative for dysuria, urgency, frequency and flank pain.  Musculoskeletal: Negative for gait problem.  Skin: Negative for rash.  Neurological: Negative for weakness and headaches.  Psychiatric/Behavioral: Negative for confusion and decreased concentration. The patient is not nervous/anxious.     Objective:  BP 128/70 mmHg  Pulse 74  Temp(Src) 98.1 F (36.7 C) (Oral)  Resp 16  Ht 6' (1.829 m)  Wt 212 lb 6.4 oz (96.344 kg)  BMI 28.80 kg/m2  SpO2 98%  Physical Exam  Constitutional: He is oriented to person, place, and time. He appears well-developed and well-nourished.  HENT:  Head: Normocephalic and atraumatic.  Eyes: Conjunctivae are normal. Pupils are equal, round, and reactive to light.  Pulmonary/Chest: Effort normal.  Musculoskeletal: He exhibits no edema.  Neurological: He is alert and oriented to person, place, and time.  Skin: Skin is dry.  Psychiatric: He has a normal mood and affect. His behavior is normal. Thought content normal.  Assessment & Plan:   Mccartney was seen today for medication refill.  Diagnoses and all orders for this visit:  Essential hypertension, benign  Other orders -     verapamil (CALAN-SR) 240 MG CR tablet; TAKE 1 TABLET (240 MG) BY MOUTH AT BEDTIME. -     telmisartan (MICARDIS) 80 MG tablet; TAKE 1 TABLET (80 MG) BY MOUTH DAILY. -     hydrochlorothiazide (HYDRODIURIL) 25 MG  tablet; Take 1 tablet (25 mg total) by mouth daily.   I have changed Mr. Jain verapamil, telmisartan, and hydrochlorothiazide.  Meds ordered this encounter  Medications  . verapamil (CALAN-SR) 240 MG CR tablet    Sig: TAKE 1 TABLET (240 MG) BY MOUTH AT BEDTIME.    Dispense:  30 tablet    Refill:  5  . telmisartan (MICARDIS) 80 MG tablet    Sig: TAKE 1 TABLET (80 MG) BY MOUTH DAILY.    Dispense:  30 tablet    Refill:  5  . hydrochlorothiazide (HYDRODIURIL) 25 MG tablet    Sig: Take 1 tablet (25 mg total) by mouth daily.    Dispense:  30 tablet    Refill:  5    Appropriate red flag conditions were discussed with the patient as well as actions that should be taken.  Patient expressed his understanding.  Follow-up: Return if symptoms worsen or fail to improve.  Carmelina Dane, MD

## 2015-05-12 ENCOUNTER — Emergency Department (HOSPITAL_COMMUNITY): Payer: BLUE CROSS/BLUE SHIELD

## 2015-05-12 ENCOUNTER — Encounter (HOSPITAL_COMMUNITY): Payer: Self-pay | Admitting: Emergency Medicine

## 2015-05-12 ENCOUNTER — Emergency Department (HOSPITAL_COMMUNITY)
Admission: EM | Admit: 2015-05-12 | Discharge: 2015-05-13 | Disposition: A | Discharge status: Home / Self Care | Payer: BLUE CROSS/BLUE SHIELD | Attending: Emergency Medicine | Admitting: Emergency Medicine

## 2015-05-12 DIAGNOSIS — R1110 Vomiting, unspecified: Secondary | ICD-10-CM | POA: Insufficient documentation

## 2015-05-12 DIAGNOSIS — Z79899 Other long term (current) drug therapy: Secondary | ICD-10-CM | POA: Insufficient documentation

## 2015-05-12 DIAGNOSIS — F17210 Nicotine dependence, cigarettes, uncomplicated: Secondary | ICD-10-CM | POA: Diagnosis not present

## 2015-05-12 DIAGNOSIS — Z8719 Personal history of other diseases of the digestive system: Secondary | ICD-10-CM | POA: Insufficient documentation

## 2015-05-12 DIAGNOSIS — J029 Acute pharyngitis, unspecified: Secondary | ICD-10-CM | POA: Diagnosis present

## 2015-05-12 DIAGNOSIS — Z8659 Personal history of other mental and behavioral disorders: Secondary | ICD-10-CM | POA: Diagnosis not present

## 2015-05-12 DIAGNOSIS — J020 Streptococcal pharyngitis: Secondary | ICD-10-CM | POA: Diagnosis not present

## 2015-05-12 DIAGNOSIS — I10 Essential (primary) hypertension: Secondary | ICD-10-CM | POA: Insufficient documentation

## 2015-05-12 LAB — URINALYSIS, ROUTINE W REFLEX MICROSCOPIC
Bilirubin Urine: NEGATIVE
GLUCOSE, UA: NEGATIVE mg/dL
Ketones, ur: NEGATIVE mg/dL
Nitrite: NEGATIVE
PH: 5 (ref 5.0–8.0)
Protein, ur: NEGATIVE mg/dL
SPECIFIC GRAVITY, URINE: 1.023 (ref 1.005–1.030)

## 2015-05-12 LAB — URINE MICROSCOPIC-ADD ON: Squamous Epithelial / LPF: NONE SEEN

## 2015-05-12 LAB — CBC
HEMATOCRIT: 42.2 % (ref 39.0–52.0)
HEMOGLOBIN: 14.1 g/dL (ref 13.0–17.0)
MCH: 25.4 pg — AB (ref 26.0–34.0)
MCHC: 33.4 g/dL (ref 30.0–36.0)
MCV: 76 fL — AB (ref 78.0–100.0)
Platelets: 235 10*3/uL (ref 150–400)
RBC: 5.55 MIL/uL (ref 4.22–5.81)
RDW: 14.3 % (ref 11.5–15.5)
WBC: 9.1 10*3/uL (ref 4.0–10.5)

## 2015-05-12 LAB — COMPREHENSIVE METABOLIC PANEL
ALBUMIN: 4.1 g/dL (ref 3.5–5.0)
ALT: 39 U/L (ref 17–63)
AST: 12 U/L — AB (ref 15–41)
Alkaline Phosphatase: 55 U/L (ref 38–126)
Anion gap: 10 (ref 5–15)
BUN: 18 mg/dL (ref 6–20)
CHLORIDE: 101 mmol/L (ref 101–111)
CO2: 26 mmol/L (ref 22–32)
CREATININE: 1.39 mg/dL — AB (ref 0.61–1.24)
Calcium: 9.3 mg/dL (ref 8.9–10.3)
GFR calc Af Amer: >60 mL/min (ref >60)
GFR calc non Af Amer: >60 mL/min (ref >60)
GLUCOSE: 127 mg/dL — AB (ref 65–99)
POTASSIUM: 3.9 mmol/L (ref 3.5–5.1)
Sodium: 137 mmol/L (ref 135–145)
Total Bilirubin: 0.4 mg/dL (ref 0.3–1.2)
Total Protein: 7.8 g/dL (ref 6.5–8.1)

## 2015-05-12 LAB — I-STAT CG4 LACTIC ACID, ED: Lactic Acid, Venous: 0.95 mmol/L (ref 0.5–2.0)

## 2015-05-12 MED ORDER — SODIUM CHLORIDE 0.9 % IV BOLUS (SEPSIS)
1000.0000 mL | Freq: Once | INTRAVENOUS | Status: AC
  Administered 2015-05-12: 1000 mL via INTRAVENOUS

## 2015-05-12 NOTE — ED Provider Notes (Signed)
[name]: [id number]     Arrival date & time 05/12/15  2215 History  By signing my name below, I, Sonum Patel, attest that this documentation has been prepared under the direction and in the presence of Gilda Crease, MD. Electronically Signed: Sonum Patel, Neurosurgeon. 05/12/2015. 11:29 PM.    Chief Complaint  Patient presents with  . Sore Throat  . Emesis    The history is provided by the patient. No language interpreter was used.    HPI Comments: Joshua Hudlow. is a 44 y.o. male who presents to the Emergency Department complaining of 3 days of fever and chills with associated sore throat, productive cough, and congestion. He reports sick contacts at home. He states the throat pain is worse with cough and swallowing. He has taken ibuprofen with some relief. He denies diarrhea, difficulty swallowing.  Past Medical History  Diagnosis Date  . Hypertension   . Migraine   . GERD (gastroesophageal reflux disease)   . Anxiety   . Facial numbness 2004    ?small vessel disease; (-) hyper-coag profile  . ANXIETY 06/03/2007  . GERD 06/03/2007  . HYPERTENSION 11/22/2006  . MIGRAINE HEADACHE 11/22/2006  . Rash 10/18/2010   Past Surgical History  Procedure Laterality Date  . Appendectomy     Family History  Problem Relation Age of Onset  . Hypertension Father   . Hypertension Mother   . Diabetes Father    Social History  Substance Use Topics  . Smoking status: Current Every Day Smoker -- 0.25 packs/day    Types: Cigarettes  . Smokeless tobacco: Never Used  . Alcohol Use: 0.0 oz/week    0 Standard drinks or equivalent per week     Comment: socially    Review of Systems  Constitutional: Positive for fever (100.6 in ED) and chills.  HENT: Positive for congestion and sore throat. Negative for trouble swallowing.   Respiratory: Positive for cough.   Gastrointestinal: Negative for diarrhea.  All other systems reviewed and are negative.     Allergies  Review of patient's  allergies indicates no known allergies.  Home Medications   Prior to Admission medications   Medication Sig Start Date End Date Taking? Authorizing Provider  hydrochlorothiazide (HYDRODIURIL) 25 MG tablet Take 1 tablet (25 mg total) by mouth daily. 03/04/15  Yes Carmelina Dane, MD  telmisartan (MICARDIS) 80 MG tablet TAKE 1 TABLET (80 MG) BY MOUTH DAILY. 03/04/15  Yes Carmelina Dane, MD  verapamil (CALAN-SR) 240 MG CR tablet TAKE 1 TABLET (240 MG) BY MOUTH AT BEDTIME. 03/04/15  Yes Carmelina Dane, MD   BP 128/83 mmHg  Pulse 103  Temp(Src) 100.6 F (38.1 C) (Oral)  Resp 20  Ht  (1.854 m)  Wt 223 lb (101.152 kg)  BMI 29.43 kg/m2  SpO2 98% Physical Exam  Constitutional: He is oriented to person, place, and time. He appears well-developed and well-nourished. No distress.  HENT:  Head: Normocephalic and atraumatic.  Right Ear: Hearing normal.  Left Ear: Hearing normal.  Nose: Nose normal.  Mouth/Throat: Mucous membranes are normal. Posterior oropharyngeal edema and posterior oropharyngeal erythema present. No oropharyngeal exudate or tonsillar abscesses.  Slightly muffled voice. Posterior oropharyngeal soft tissue swelling and erythema without exudate or abscess.   Eyes: Conjunctivae and EOM are normal. Pupils are equal, round, and reactive to light.  Neck: Normal range of motion. Neck supple.  Cardiovascular: Regular rhythm, S1 normal and S2 normal.  Exam reveals no gallop and no friction  rub.   No murmur heard. Pulmonary/Chest: Effort normal and breath sounds normal. No respiratory distress. He exhibits no tenderness.  Abdominal: Soft. Normal appearance and bowel sounds are normal. There is no hepatosplenomegaly. There is no tenderness. There is no rebound, no guarding, no tenderness at McBurney's point and negative Murphy's sign. No hernia.  Musculoskeletal: Normal range of motion.  Neurological: He is alert and oriented to person, place, and time. He has normal  strength. No cranial nerve deficit or sensory deficit. Coordination normal. GCS eye subscore is 4. GCS verbal subscore is 5. GCS motor subscore is 6.  Skin: Skin is warm, dry and intact. No rash noted. No cyanosis.  Psychiatric: He has a normal mood and affect. His speech is normal and behavior is normal. Thought content normal.  Nursing note and vitals reviewed.   ED Course  Procedures (including critical care time)  DIAGNOSTIC STUDIES: Oxygen Saturation is 98% on RA, normal by my interpretation.    COORDINATION OF CARE: 11:34 PM Discussed treatment plan with pt at bedside and pt agreed to plan.   Labs Review Labs Reviewed  URINALYSIS, ROUTINE W REFLEX MICROSCOPIC (NOT AT [facility]) - Abnormal; Notable for the following:    Hgb urine dipstick TRACE (*)    Leukocytes, UA TRACE (*)    All other components within normal limits  CBC - Abnormal; Notable for the following:    MCV 76.0 (*)    MCH 25.4 (*)    All other components within normal limits  URINE MICROSCOPIC-ADD ON - Abnormal; Notable for the following:    Bacteria, UA RARE (*)    All other components within normal limits  URINE CULTURE  COMPREHENSIVE METABOLIC PANEL  I-STAT CG4 LACTIC ACID, ED    Imaging Review Dg Chest 2 View  05/12/2015  CLINICAL DATA:  Acute onset of cough, fever and shortness of breath. Initial encounter. EXAM: CHEST  2 VIEW COMPARISON:  Chest radiograph performed 05/09/2006 FINDINGS: The lungs are well-aerated and clear. There is no evidence of focal opacification, pleural effusion or pneumothorax. The heart is normal in size; the mediastinal contour is within normal limits. No acute osseous abnormalities are seen. IMPRESSION: No acute cardiopulmonary process seen. Electronically Signed   By: Roanna RaiderJeffery  Chang M.D.   On: 05/12/2015 22:41   I have personally reviewed and evaluated these images and lab results as part of my medical decision-making.   EKG Interpretation None      MDM   Final diagnoses:   None   strep pharyngitis  Patient with sore throat and fever, mild cough and nausea and vomiting. Strep is positive. Patient administered IV fluids here in the ER, Decadron, Bicillin L-A.  I personally performed the services described in this documentation, which was scribed in my presence. The recorded information has been reviewed and is accurate.   Gilda Creasehristopher J Pollina, MD 05/13/15 [phone]

## 2015-05-12 NOTE — ED Notes (Signed)
Pt states on Sunday he started having chills and breaking out into cold sweats, pt also reports productive cough and congestion since Sunday. Pt states he started vomiting yesterday and today has just had "dry heaves". Pt also reports a lot of pressure in head.

## 2015-05-13 LAB — RAPID STREP SCREEN (MED CTR MEBANE ONLY): STREPTOCOCCUS, GROUP A SCREEN (DIRECT): POSITIVE — AB

## 2015-05-13 MED ORDER — PENICILLIN G BENZATHINE 1200000 UNIT/2ML IM SUSP
1.2000 10*6.[IU] | Freq: Once | INTRAMUSCULAR | Status: AC
  Administered 2015-05-13: 1.2 10*6.[IU] via INTRAMUSCULAR
  Filled 2015-05-13: qty 2

## 2015-05-13 MED ORDER — DEXAMETHASONE SODIUM PHOSPHATE 10 MG/ML IJ SOLN
10.0000 mg | Freq: Once | INTRAMUSCULAR | Status: AC
  Administered 2015-05-13: 10 mg via INTRAVENOUS
  Filled 2015-05-13: qty 1

## 2015-05-13 NOTE — Discharge Instructions (Signed)
Pharyngitis Pharyngitis is redness, pain, and swelling (inflammation) of your pharynx.  CAUSES  Pharyngitis is usually caused by infection. Most of the time, these infections are from viruses (viral) and are part of a cold. However, sometimes pharyngitis is caused by bacteria (bacterial). Pharyngitis can also be caused by allergies. Viral pharyngitis may be spread from person to person by coughing, sneezing, and personal items or utensils (cups, forks, spoons, toothbrushes). Bacterial pharyngitis may be spread from person to person by more intimate contact, such as kissing.  SIGNS AND SYMPTOMS  Symptoms of pharyngitis include:   Sore throat.   Tiredness (fatigue).   Low-grade fever.   Headache.  Joint pain and muscle aches.  Skin rashes.  Swollen lymph nodes.  Plaque-like film on throat or tonsils (often seen with bacterial pharyngitis). DIAGNOSIS  Your health care provider will ask you questions about your illness and your symptoms. Your medical history, along with a physical exam, is often all that is needed to diagnose pharyngitis. Sometimes, a rapid strep test is done. Other lab tests may also be done, depending on the suspected cause.  TREATMENT  Viral pharyngitis will usually get better in 3-4 days without the use of medicine. Bacterial pharyngitis is treated with medicines that kill germs (antibiotics).  HOME CARE INSTRUCTIONS   Drink enough water and fluids to keep your urine clear or pale yellow.   Only take over-the-counter or prescription medicines as directed by your health care provider:   If you are prescribed antibiotics, make sure you finish them even if you start to feel better.   Do not take aspirin.   Get lots of rest.   Gargle with 8 oz of salt water ( tsp of salt per 1 qt of water) as often as every 1-2 hours to soothe your throat.   Throat lozenges (if you are not at risk for choking) or sprays may be used to soothe your throat. SEEK MEDICAL  CARE IF:   You have large, tender lumps in your neck.  You have a rash.  You cough up green, yellow-brown, or bloody spit. SEEK IMMEDIATE MEDICAL CARE IF:   Your neck becomes stiff.  You drool or are unable to swallow liquids.  You vomit or are unable to keep medicines or liquids down.  You have severe pain that does not go away with the use of recommended medicines.  You have trouble breathing (not caused by a stuffy nose). MAKE SURE YOU:   Understand these instructions.  Will watch your condition.  Will get help right away if you are not doing well or get worse.   This information is not intended to replace advice given to you by your health care provider. Make sure you discuss any questions you have with your health care provider.   Document Released: 06/05/2005 Document Revised: 03/26/2013 Document Reviewed: 02/10/2013 Elsevier Interactive Patient Education 2016 Elsevier Inc.  Strep Throat Strep throat is an infection of the throat. It is caused by germs. Strep throat spreads from person to person because of coughing, sneezing, or close contact. HOME CARE Medicines  Take over-the-counter and prescription medicines only as told by your doctor.  Take your antibiotic medicine as told by your doctor. Do not stop taking the medicine even if you feel better.  Have family members who also have a sore throat or fever go to a doctor. Eating and Drinking  Do not share food, drinking cups, or personal items.  Try eating soft foods until your sore throat feels  better.  Drink enough fluid to keep your pee (urine) clear or pale yellow. General Instructions  Rinse your mouth (gargle) with a salt-water mixture 3-4 times per day or as needed. To make a salt-water mixture, stir -1 tsp of salt into 1 cup of warm water.  Make sure that all people in your house wash their hands well.  Rest.  Stay home from school or work until you have been taking antibiotics for 24  hours.  Keep all follow-up visits as told by your doctor. This is important. GET HELP IF:  Your neck keeps getting bigger.  You get a rash, cough, or earache.  You cough up thick liquid that is green, yellow-brown, or bloody.  You have pain that does not get better with medicine.  Your problems get worse instead of getting better.  You have a fever. GET HELP RIGHT AWAY IF:  You throw up (vomit).  You get a very bad headache.  You neck hurts or it feels stiff.  You have chest pain or you are short of breath.  You have drooling, very bad throat pain, or changes in your voice.  Your neck is swollen or the skin gets red and tender.  Your mouth is dry or you are peeing less than normal.  You keep feeling more tired or it is hard to wake up.  Your joints are red or they hurt.   This information is not intended to replace advice given to you by your health care provider. Make sure you discuss any questions you have with your health care provider.   Document Released: 11/22/2007 Document Revised: 02/24/2015 Document Reviewed: 09/28/2014 Elsevier Interactive Patient Education [employer]

## 2015-05-14 LAB — URINE CULTURE

## 2015-07-01 ENCOUNTER — Telehealth: Payer: Self-pay

## 2015-07-02 ENCOUNTER — Telehealth: Payer: Self-pay

## 2015-07-02 NOTE — Telephone Encounter (Signed)
Pt was wondering if we have any coupons or free samples  for his script telmisartan (MICARDIS) 80 MG tablet [[id number]]. This costs $160 per bottle and he can't afford it right now(in between insurance's). CB # [phone]

## 2015-07-05 NOTE — Telephone Encounter (Signed)
Called and lmom - Gave information for coupons on [email or web address]. He will call back if he has questions.

## 2015-08-24 ENCOUNTER — Other Ambulatory Visit: Payer: Self-pay | Admitting: Family Medicine

## 2015-09-08 ENCOUNTER — Telehealth: Payer: Self-pay

## 2015-09-08 MED ORDER — VERAPAMIL HCL ER 240 MG PO TBCR: EXTENDED_RELEASE_TABLET | ORAL | Status: DC

## 2015-09-08 MED ORDER — TELMISARTAN 80 MG PO TABS: ORAL_TABLET | ORAL | Status: DC

## 2015-09-08 NOTE — Telephone Encounter (Signed)
Pharm called and req'd RFs of Micardis and verapamil. I OKd 30 day RFs of each and pharm agreed to let pt know that he needs OV for more.

## 2015-09-22 ENCOUNTER — Other Ambulatory Visit: Payer: Self-pay | Admitting: Family Medicine

## 2015-10-06 ENCOUNTER — Other Ambulatory Visit: Payer: Self-pay

## 2015-10-06 MED ORDER — TELMISARTAN 80 MG PO TABS: ORAL_TABLET | ORAL | Status: DC

## 2015-10-06 MED ORDER — VERAPAMIL HCL ER 240 MG PO TBCR: EXTENDED_RELEASE_TABLET | ORAL | Status: DC

## 2015-10-12 ENCOUNTER — Other Ambulatory Visit: Payer: Self-pay | Admitting: Family Medicine

## 2015-10-17 ENCOUNTER — Ambulatory Visit (INDEPENDENT_AMBULATORY_CARE_PROVIDER_SITE_OTHER): Payer: Managed Care, Other (non HMO) | Admitting: Family Medicine

## 2015-10-17 VITALS — BP 128/70 | HR 82 | Temp 98.5°F | Resp 14 | Ht 73.0 in | Wt 207.0 lb

## 2015-10-17 DIAGNOSIS — R7303 Prediabetes: Secondary | ICD-10-CM | POA: Diagnosis not present

## 2015-10-17 DIAGNOSIS — F17200 Nicotine dependence, unspecified, uncomplicated: Secondary | ICD-10-CM

## 2015-10-17 DIAGNOSIS — I10 Essential (primary) hypertension: Secondary | ICD-10-CM | POA: Diagnosis not present

## 2015-10-17 DIAGNOSIS — Z5181 Encounter for therapeutic drug level monitoring: Secondary | ICD-10-CM

## 2015-10-17 LAB — BASIC METABOLIC PANEL
BUN: 22 mg/dL (ref 7–25)
CALCIUM: 9.9 mg/dL (ref 8.6–10.3)
CO2: 24 mmol/L (ref 20–31)
CREATININE: 1.19 mg/dL (ref 0.60–1.35)
Chloride: 105 mmol/L (ref 98–110)
Glucose, Bld: 106 mg/dL — ABNORMAL HIGH (ref 65–99)
Potassium: 4.6 mmol/L (ref 3.5–5.3)
Sodium: 138 mmol/L (ref 135–146)

## 2015-10-17 MED ORDER — VERAPAMIL HCL ER 240 MG PO TBCR: EXTENDED_RELEASE_TABLET | ORAL | Status: DC

## 2015-10-17 MED ORDER — TELMISARTAN 80 MG PO TABS: ORAL_TABLET | ORAL | Status: DC

## 2015-10-17 MED ORDER — HYDROCHLOROTHIAZIDE 25 MG PO TABS: 25.0000 mg | ORAL_TABLET | Freq: Every day | ORAL | Status: DC

## 2015-10-17 NOTE — Progress Notes (Signed)
Subjective:    Patient ID: Joshua Bruns., male    DOB: 1971-05-01, 45 y.o.   MRN: [id number] By signing my name below, I, [name], attest that this documentation has been prepared under the direction and in the presence of Joshua Sorenson, MD. Electronically Signed: Javier [name], ER Scribe. 10/17/2015. 1:35 PM.  Chief Complaint  Patient presents with  . Medication Refill    HPI HPI Comments: Joshua Lader. is a 45 y.o. male who presents to [facility] reporting for a refill of his blood pressure medications. He does not take his blood pressure outside of the office. He had blood work in the hospital in November and his kidney results were slightly abnormal, and that has not been rechecked since that time.  He is also interested in stopping smoking. He is smoking three black in mild cigars per day. He has not tried the patch or gum.    Past Medical History  Diagnosis Date  . Hypertension   . Migraine   . GERD (gastroesophageal reflux disease)   . Anxiety   . Facial numbness 2004    ?small vessel disease; (-) hyper-coag profile  . ANXIETY 06/03/2007  . GERD 06/03/2007  . HYPERTENSION 11/22/2006  . MIGRAINE HEADACHE 11/22/2006  . Rash 10/18/2010   No Known Allergies   Current Outpatient Prescriptions on File Prior to Visit  Medication Sig Dispense Refill  . hydrochlorothiazide (HYDRODIURIL) 25 MG tablet TAKE 1 TABLET BY MOUTH DAILY, NO MORE REFILLS WITHOUT OFFICE VISIT 15 tablet 0  . telmisartan (MICARDIS) 80 MG tablet TAKE 1 TABLET (80 MG) BY MOUTH DAILY. 15 tablet 0  . verapamil (CALAN-SR) 240 MG CR tablet TAKE 1 TABLET (240 MG) BY MOUTH AT BEDTIME. 15 tablet 0   No current facility-administered medications on file prior to visit.   Depression screen [facility] 2/9 10/17/2015 10/17/2015 10/17/2015 03/04/2015  Decreased Interest 0 0 0 0  Down, Depressed, Hopeless 0 0 0 0  PHQ - 2 Score 0 0 0 0     Review of Systems  Constitutional: Negative for fever, chills, activity  change, appetite change and unexpected weight change.  Respiratory: Negative for cough and shortness of breath.   Cardiovascular: Negative for chest pain, palpitations and leg swelling.  Gastrointestinal: Negative for abdominal pain and blood in stool.  Genitourinary: Negative for urgency, frequency and decreased urine volume.  Neurological: Negative for numbness and headaches.  Psychiatric/Behavioral: Negative for dysphoric mood.      Objective:  BP 128/70 mmHg  Pulse 82  Temp(Src) 98.5 F (36.9 C) (Oral)  Resp 14  Ht  (1.854 m)  Wt 207 lb (93.895 kg)  BMI 27.32 kg/m2  SpO2 98%  Physical Exam  Constitutional: He is oriented to person, place, and time. He appears well-developed and well-nourished. No distress.  HENT:  Head: Normocephalic and atraumatic.  Eyes: Pupils are equal, round, and reactive to light.  Neck: Neck supple.  Cardiovascular: Normal rate, regular rhythm and normal heart sounds.   No murmur heard. Pulmonary/Chest: Effort normal and breath sounds normal. No respiratory distress. He has no wheezes.  Musculoskeletal: Normal range of motion.  Neurological: He is alert and oriented to person, place, and time. Coordination normal.  Skin: Skin is warm and dry. He is not diaphoretic.  Psychiatric: He has a normal mood and affect. His behavior is normal.  Nursing note and vitals reviewed.     Assessment & Plan:   1. Essential hypertension - well  controlled, meds refilled.  2. Medication monitoring encounter   3. Tobacco use disorder - reviewed treatment options. Pt will call if he decides he wants to try buproprion or chantix but for now he will try nicotine replacement gum.  Try calling [phone]  4. Prediabetes     Orders Placed This Encounter  Procedures  . Basic metabolic panel    Order Specific Question:  Has the patient fasted?    Answer:  No  . Lipid panel    Standing Status: Future     Number of Occurrences:      Standing Expiration Date:  10/16/2016    Order Specific Question:  Has the patient fasted?    Answer:  Yes  . Hemoglobin A1c    Standing Status: Future     Number of Occurrences:      Standing Expiration Date: 10/16/2016    Meds ordered this encounter  Medications  . telmisartan (MICARDIS) 80 MG tablet    Sig: TAKE 1 TABLET (80 MG) BY MOUTH DAILY.    Dispense:  30 tablet    Refill:  11  . verapamil (CALAN-SR) 240 MG CR tablet    Sig: TAKE 1 TABLET (240 MG) BY MOUTH AT BEDTIME.    Dispense:  30 tablet    Refill:  11  . hydrochlorothiazide (HYDRODIURIL) 25 MG tablet    Sig: Take 1 tablet (25 mg total) by mouth daily.    Dispense:  30 tablet    Refill:  11    Office visit needed for refills    I personally performed the services described in this documentation, which was scribed in my presence. The recorded information has been reviewed and considered, and addended by me as needed.  Joshua SorensonEva Shaw, MD MPH

## 2015-10-17 NOTE — Patient Instructions (Addendum)
   IF you received an x-ray today, you will receive an invoice from [facility]. Please contact [facility] at [phone] with questions or concerns regarding your invoice.   IF you received labwork today, you will receive an invoice from [facility]. Please contact [facility] at [phone] with questions or concerns regarding your invoice.   Our billing staff will not be able to assist you with questions regarding bills from these companies.  You will be contacted with the lab results as soon as they are available. The fastest way to get your results is to activate your My Chart account. Instructions are located on the last page of this paperwork. If you have not heard from us regarding the results in 2 weeks, please contact this office.     Smoking Cessation, Tips for Success If you are ready to quit smoking, congratulations! You have chosen to help yourself be healthier. Cigarettes bring nicotine, tar, carbon monoxide, and other irritants into your body. Your lungs, heart, and blood vessels will be able to work better without these poisons. There are many different ways to quit smoking. Nicotine gum, nicotine patches, a nicotine inhaler, or nicotine nasal spray can help with physical craving. Hypnosis, support groups, and medicines help break the habit of smoking. WHAT THINGS CAN I DO TO MAKE QUITTING EASIER?  Here are some tips to help you quit for good:  Pick a date when you will quit smoking completely. Tell all of your friends and family about your plan to quit on that date.  Do not try to slowly cut down on the number of cigarettes you are smoking. Pick a quit date and quit smoking completely starting on that day.  Throw away all cigarettes.   Clean and remove all ashtrays from your home, work, and car.  On a card, write down your reasons for quitting. Carry the card with you and read it when you get the urge to smoke.  Cleanse your  body of nicotine. Drink enough water and fluids to keep your urine clear or pale yellow. Do this after quitting to flush the nicotine from your body.  Learn to predict your moods. Do not let a bad situation be your excuse to have a cigarette. Some situations in your life might tempt you into wanting a cigarette.  Never have "just one" cigarette. It leads to wanting another and another. Remind yourself of your decision to quit.  Change habits associated with smoking. If you smoked while driving or when feeling stressed, try other activities to replace smoking. Stand up when drinking your coffee. Brush your teeth after eating. Sit in a different chair when you read the paper. Avoid alcohol while trying to quit, and try to drink fewer caffeinated beverages. Alcohol and caffeine may urge you to smoke.  Avoid foods and drinks that can trigger a desire to smoke, such as sugary or spicy foods and alcohol.  Ask people who smoke not to smoke around you.  Have something planned to do right after eating or having a cup of coffee. For example, plan to take a walk or exercise.  Try a relaxation exercise to calm you down and decrease your stress. Remember, you may be tense and nervous for the first 2 weeks after you quit, but this will pass.  Find new activities to keep your hands busy. Play with a pen, coin, or rubber band. Doodle or draw things on paper.  Brush your teeth right after eating. This will   help cut down on the craving for the taste of tobacco after meals. You can also try mouthwash.   Use oral substitutes in place of cigarettes. Try using lemon drops, carrots, cinnamon sticks, or chewing gum. Keep them handy so they are available when you have the urge to smoke.  When you have the urge to smoke, try deep breathing.  Designate your home as a nonsmoking area.  If you are a heavy smoker, ask your health care provider about a prescription for nicotine chewing gum. It can ease your withdrawal  from nicotine.  Reward yourself. Set aside the cigarette money you save and buy yourself something nice.  Look for support from others. Join a support group or smoking cessation program. Ask someone at home or at work to help you with your plan to quit smoking.  Always ask yourself, "Do I need this cigarette or is this just a reflex?" Tell yourself, "Today, I choose not to smoke," or "I do not want to smoke." You are reminding yourself of your decision to quit.  Do not replace cigarette smoking with electronic cigarettes (commonly called e-cigarettes). The safety of e-cigarettes is unknown, and some may contain harmful chemicals.  If you relapse, do not give up! Plan ahead and think about what you will do the next time you get the urge to smoke. HOW WILL I FEEL WHEN I QUIT SMOKING? You may have symptoms of withdrawal because your body is used to nicotine (the addictive substance in cigarettes). You may crave cigarettes, be irritable, feel very hungry, cough often, get headaches, or have difficulty concentrating. The withdrawal symptoms are only temporary. They are strongest when you first quit but will go away within 10-14 days. When withdrawal symptoms occur, stay in control. Think about your reasons for quitting. Remind yourself that these are signs that your body is healing and getting used to being without cigarettes. Remember that withdrawal symptoms are easier to treat than the major diseases that smoking can cause.  Even after the withdrawal is over, expect periodic urges to smoke. However, these cravings are generally short lived and will go away whether you smoke or not. Do not smoke! WHAT RESOURCES ARE AVAILABLE TO HELP ME QUIT SMOKING? Your health care provider can direct you to community resources or hospitals for support, which may include:  Group support.  Education.  Hypnosis.  Therapy.   This information is not intended to replace advice given to you by your health care  provider. Make sure you discuss any questions you have with your health care provider.   Document Released: 03/03/2004 Document Revised: 06/26/2014 Document Reviewed: 11/21/2012 Elsevier Interactive Patient Education 2016 [employer]. Steps to Quit Smoking  Smoking tobacco can be harmful to your health and can affect almost every organ in your body. Smoking puts you, and those around you, at risk for developing many serious chronic diseases. Quitting smoking is difficult, but it is one of the best things that you can do for your health. It is never too late to quit. WHAT ARE THE BENEFITS OF QUITTING SMOKING? When you quit smoking, you lower your risk of developing serious diseases and conditions, such as:  Lung cancer or lung disease, such as COPD.  Heart disease.  Stroke.  Heart attack.  Infertility.  Osteoporosis and bone fractures. Additionally, symptoms such as coughing, wheezing, and shortness of breath may get better when you quit. You may also find that you get sick less often because your body is stronger at fighting  off colds and infections. If you are pregnant, quitting smoking can help to reduce your chances of having a baby of low birth weight. HOW DO I GET READY TO QUIT? When you decide to quit smoking, create a plan to make sure that you are successful. Before you quit:  Pick a date to quit. Set a date within the next two weeks to give you time to prepare.  Write down the reasons why you are quitting. Keep this list in places where you will see it often, such as on your bathroom mirror or in your car or wallet.  Identify the people, places, things, and activities that make you want to smoke (triggers) and avoid them. Make sure to take these actions:  Throw away all cigarettes at home, at work, and in your car.  Throw away smoking accessories, such as Set designer.  Clean your car and make sure to empty the ashtray.  Clean your home, including curtains and  carpets.  Tell your family, friends, and coworkers that you are quitting. Support from your loved ones can make quitting easier.  Talk with your health care provider about your options for quitting smoking.  Find out what treatment options are covered by your health insurance. WHAT STRATEGIES CAN I USE TO QUIT SMOKING?  Talk with your healthcare provider about different strategies to quit smoking. Some strategies include:  Quitting smoking altogether instead of gradually lessening how much you smoke over a period of time. Research shows that quitting "cold [place]" is more successful than gradually quitting.  Attending in-person counseling to help you build problem-solving skills. You are more likely to have success in quitting if you attend several counseling sessions. Even short sessions of 10 minutes can be effective.  Finding resources and support systems that can help you to quit smoking and remain smoke-free after you quit. These resources are most helpful when you use them often. They can include:  Online chats with a Veterinary surgeon.  Telephone quitlines.  Printed Materials engineer.  Support groups or group counseling.  Text messaging programs.  Mobile phone applications.  Taking medicines to help you quit smoking. (If you are pregnant or breastfeeding, talk with your health care provider first.) Some medicines contain nicotine and some do not. Both types of medicines help with cravings, but the medicines that include nicotine help to relieve withdrawal symptoms. Your health care provider may recommend:  Nicotine patches, gum, or lozenges.  Nicotine inhalers or sprays.  Non-nicotine medicine that is taken by mouth. Talk with your health care provider about combining strategies, such as taking medicines while you are also receiving in-person counseling. Using these two strategies together makes you more likely to succeed in quitting than if you used either strategy on its  own. If you are pregnant or breastfeeding, talk with your health care provider about finding counseling or other support strategies to quit smoking. Do not take medicine to help you quit smoking unless told to do so by your health care provider. WHAT THINGS CAN I DO TO MAKE IT EASIER TO QUIT? Quitting smoking might feel overwhelming at first, but there is a lot that you can do to make it easier. Take these important actions:  Reach out to your family and friends and ask that they support and encourage you during this time. Call telephone quitlines, reach out to support groups, or work with a counselor for support.  Ask people who smoke to avoid smoking around you.  Avoid places that trigger you  to smoke, such as bars, parties, or smoke-break areas at work.  Spend time around people who do not smoke.  Lessen stress in your life, because stress can be a smoking trigger for some people. To lessen stress, try:  Exercising regularly.  Deep-breathing exercises.  Yoga.  Meditating.  Performing a body scan. This involves closing your eyes, scanning your body from head to toe, and noticing which parts of your body are particularly tense. Purposefully relax the muscles in those areas.  Download or purchase mobile phone or tablet apps (applications) that can help you stick to your quit plan by providing reminders, tips, and encouragement. There are many free apps, such as QuitGuide from the [employer] developer(Centers for Disease Control and Prevention). You can find other support for quitting smoking (smoking cessation) through smokefree.gov and other websites. HOW WILL I FEEL WHEN I QUIT SMOKING? Within the first 24 hours of quitting smoking, you may start to feel some withdrawal symptoms. These symptoms are usually most noticeable 2-3 days after quitting, but they usually do not last beyond 2-3 weeks. Changes or symptoms that you might experience include:  Mood swings.  Restlessness, anxiety, or  irritation.  Difficulty concentrating.  Dizziness.  Strong cravings for sugary foods in addition to nicotine.  Mild weight gain.  Constipation.  Nausea.  Coughing or a sore throat.  Changes in how your medicines work in your body.  A depressed mood.  Difficulty sleeping (insomnia). After the first 2-3 weeks of quitting, you may start to notice more positive results, such as:  Improved sense of smell and taste.  Decreased coughing and sore throat.  Slower heart rate.  Lower blood pressure.  Clearer skin.  The ability to breathe more easily.  Fewer sick days. Quitting smoking is very challenging for most people. Do not get discouraged if you are not successful the first time. Some people need to make many attempts to quit before they achieve long-term success. Do your best to stick to your quit plan, and talk with your health care provider if you have any questions or concerns.   This information is not intended to replace advice given to you by your health care provider. Make sure you discuss any questions you have with your health care provider.   Document Released: 05/30/2001 Document Revised: 10/20/2014 Document Reviewed: 10/20/2014 Elsevier Interactive Patient Education [employer]2016 Elsevier Inc.

## 2015-10-24 ENCOUNTER — Encounter: Payer: Self-pay | Admitting: Family Medicine

## 2015-11-08 ENCOUNTER — Other Ambulatory Visit: Payer: Self-pay | Admitting: Family Medicine

## 2015-12-06 ENCOUNTER — Ambulatory Visit (INDEPENDENT_AMBULATORY_CARE_PROVIDER_SITE_OTHER): Payer: Managed Care, Other (non HMO) | Admitting: Family Medicine

## 2015-12-06 VITALS — BP 128/82 | HR 86 | Temp 98.3°F | Resp 18 | Ht 73.0 in | Wt 204.3 lb

## 2015-12-06 DIAGNOSIS — S2096XA Insect bite (nonvenomous) of unspecified parts of thorax, initial encounter: Secondary | ICD-10-CM

## 2015-12-06 DIAGNOSIS — L299 Pruritus, unspecified: Secondary | ICD-10-CM

## 2015-12-06 DIAGNOSIS — S50862A Insect bite (nonvenomous) of left forearm, initial encounter: Secondary | ICD-10-CM | POA: Diagnosis not present

## 2015-12-06 DIAGNOSIS — W57XXXA Bitten or stung by nonvenomous insect and other nonvenomous arthropods, initial encounter: Secondary | ICD-10-CM | POA: Diagnosis not present

## 2015-12-06 MED ORDER — TRIAMCINOLONE ACETONIDE 0.1 % EX CREA: 1.0000 "application " | TOPICAL_CREAM | Freq: Two times a day (BID) | CUTANEOUS | Status: DC

## 2015-12-06 MED ORDER — METHYLPREDNISOLONE ACETATE 80 MG/ML IJ SUSP
80.0000 mg | Freq: Once | INTRAMUSCULAR | Status: AC
Start: 2015-12-06 — End: 2015-12-06
  Administered 2015-12-06: 80 mg via INTRAMUSCULAR

## 2015-12-06 NOTE — Progress Notes (Signed)
Patient ID: Joshua Brunsonald L Nickolas Jr., male    DOB: 01/31/1971  Age: 45 y.o. MRN: [id number]  Chief Complaint  Patient presents with  . Insect Bite    Located on lower left arm. Now has a rash on arm and chest x4 days    Subjective:   For 5 days ago the patient started noticing some hard nodular bumps on his arms and trunk. These itch terribly. He is not seeing anything that bit him. He is not on any medicines. He has not had this in the past. He has not had any history or evidence of bed bugs. His male companion has not had any of these places. 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 Today he has a place on his left upper lid which is similar.  He does take blood pressure medicines including hydrochlorothiazide, telmisartan, and verapamil.  Current allergies, medications, problem list, past/family and social histories reviewed.  Objective:  BP 128/82 mmHg  Pulse 86  Temp(Src) 98.3 F (36.8 C) (Oral)  Resp 18  Ht 6\' 1"  (1.854 m)  Wt 204 lb 4.8 oz (92.67 kg)  BMI 26.96 kg/m2  SpO2 99%  Scattered nodular subcutaneous spots which itch. Some are overall on the surface, possibly just from scratching, possibly from a bite. Left upper eyelid is puffy as noted.  Assessment & Plan:   Assessment: 1. Insect bites   2. Itching       Plan: These appear to be some kind of insect bite it is gotten him. We'll treat to try and calm down the allergic response he is having with the itching. If he is not doing better he is to return.  No orders of the defined types were placed in this encounter.    Meds ordered this encounter  Medications  . methylPREDNISolone acetate (DEPO-MEDROL) injection 80 mg    Sig:   . triamcinolone cream (KENALOG) 0.1 %    Sig: Apply 1 application topically 2 (two) times daily.    Dispense:  30 g    Refill:  0         Patient Instructions   You have been given a shot of Depo-Medrol  80 mg which is a cortisone  injection that should last for 5 or 6 days. This should help decrease the allergic swelling and itching.  Take over-the-counter Zyrtec (cetirizine) once or twice daily as needed for itching  Use some triamcinolone cream on each of the lesions twice daily.  Return if problems    IF you received an x-ray today, you will receive an invoice from [facility]. Please contact [facility] at [phone] with questions or concerns regarding your invoice.   IF you received labwork today, you will receive an invoice from [facility]. Please contact [facility] at [phone] with questions or concerns regarding your invoice.   Our billing staff will not be able to assist you with questions regarding bills from these companies.  You will be contacted with the lab results as soon as they are available. The fastest way to get your results is to activate your My Chart account. Instructions are located on the last page of this paperwork. If you have not heard from us regarding the results in 2 weeks, please contact this office.          Return if symptoms worsen or fail to improve.   HOPPER,DAVID, MD 12/06/2015

## 2015-12-06 NOTE — Patient Instructions (Addendum)
You have been given a shot of Depo-Medrol  80 mg which is a cortisone injection that should last for 5 or 6 days. This should help decrease the allergic swelling and itching.  Take over-the-counter Zyrtec (cetirizine) once or twice daily as needed for itching  Use some triamcinolone cream on each of the lesions twice daily.  Return if problems    IF you received an x-ray today, you will receive an invoice from [facility]. Please contact [facility] at [phone] with questions or concerns regarding your invoice.   IF you received labwork today, you will receive an invoice from [facility]. Please contact [facility] at [phone] with questions or concerns regarding your invoice.   Our billing staff will not be able to assist you with questions regarding bills from these companies.  You will be contacted with the lab results as soon as they are available. The fastest way to get your results is to activate your My Chart account. Instructions are located on the last page of this paperwork. If you have not heard from us regarding the results in 2 weeks, please contact this office.

## 2015-12-10 ENCOUNTER — Ambulatory Visit (INDEPENDENT_AMBULATORY_CARE_PROVIDER_SITE_OTHER): Payer: Managed Care, Other (non HMO) | Admitting: Family Medicine

## 2015-12-10 VITALS — BP 124/76 | HR 94 | Temp 98.7°F | Resp 16 | Ht 73.0 in | Wt 204.0 lb

## 2015-12-10 DIAGNOSIS — R21 Rash and other nonspecific skin eruption: Secondary | ICD-10-CM | POA: Diagnosis not present

## 2015-12-10 DIAGNOSIS — S2096XA Insect bite (nonvenomous) of unspecified parts of thorax, initial encounter: Secondary | ICD-10-CM

## 2015-12-10 DIAGNOSIS — S50862A Insect bite (nonvenomous) of left forearm, initial encounter: Secondary | ICD-10-CM

## 2015-12-10 DIAGNOSIS — W57XXXA Bitten or stung by nonvenomous insect and other nonvenomous arthropods, initial encounter: Secondary | ICD-10-CM

## 2015-12-10 NOTE — Progress Notes (Addendum)
By signing my name below, I, [name], attest that this documentation has been prepared under the direction and in the presence of Joshua Staggers, MD.  Electronically Signed: Arvilla [name], Medical Scribe. 12/10/2015. 12:17 PM.  Subjective:    Patient ID: [name]., male    DOB: 1970-09-14, 45 y.o.   MRN: [id number]  HPI Chief Complaint  Patient presents with  . Urticaria    Neck    HPI Comments: [name]. is a 45 y.o. male who presents to the [facility] complaining of rash on his neck.t, He was seen 4 days ago for insect bites on his arm. He was treated with Triamcinolone cream and Depo-Medrol injection, he was also advised to take OTC Zyrtec. He was bit on his hand at his job around 2 weeks ago and has subsided by now. Pt took Benadryl for relief.  Pt states this rash is different and woke up this morning with it. Pt also has a rash on his legs 2 days ago at work- didn't wake up with them. Pt thinks he has fleas, or bed bugs. Pt has a rash on his shoulder. Pt states it's about 3-4 in a row when he sees them. Pt states his girlfriend, who sleeps in the same bed as him, isn't getting any bites. Pt has a new cologne that he got about June 2nd. Pt denies using a new lotion/cream, or clothes detergent. Pt has been taking Zyrtec for relief.   Patient Active Problem List   Diagnosis Date Noted  . Bursitis, prepatellar 03/09/2011  . Rash 10/18/2010  . ANXIETY 06/03/2007  . GERD 06/03/2007  . MIGRAINE HEADACHE 11/22/2006  . Essential hypertension 11/22/2006   Past Medical History  Diagnosis Date  . Hypertension   . Migraine   . GERD (gastroesophageal reflux disease)   . Anxiety   . Facial numbness 2004    ?small vessel disease; (-) hyper-coag profile  . ANXIETY 06/03/2007  . GERD 06/03/2007  . HYPERTENSION 11/22/2006  . MIGRAINE HEADACHE 11/22/2006  . Rash 10/18/2010   Past Surgical History  Procedure Laterality Date  . Appendectomy     No  Known Allergies Prior to Admission medications   Medication Sig Start Date End Date Taking? Authorizing Provider  hydrochlorothiazide (HYDRODIURIL) 25 MG tablet Take 1 tablet (25 mg total) by mouth daily. 10/17/15   Sherren Mocha, MD  telmisartan (MICARDIS) 80 MG tablet TAKE 1 TABLET (80 MG) BY MOUTH DAILY. 10/17/15   Sherren Mocha, MD  triamcinolone cream (KENALOG) 0.1 % Apply 1 application topically 2 (two) times daily. 12/06/15   Joshua Najjar, MD  verapamil (CALAN-SR) 240 MG CR tablet TAKE 1 TABLET (240 MG) BY MOUTH AT BEDTIME. 10/17/15   Sherren Mocha, MD   Social History   Social History  . Marital Status: Single    Spouse Name: N/A  . Number of Children: N/A  . Years of Education: N/A   Occupational History  . Not on file.   Social History Main Topics  . Smoking status: Current Every Day Smoker -- 0.25 packs/day    Types: Cigarettes  . Smokeless tobacco: Never Used  . Alcohol Use: 0.0 oz/week    0 Standard drinks or equivalent per week     Comment: socially  . Drug Use: No  . Sexual Activity: Not on file   Other Topics Concern  . Not on file   Social History Narrative    Review  of Systems  Respiratory: Negative for shortness of breath and wheezing.   Skin: Positive for rash.    Objective:  BP 124/76 mmHg  Pulse 94  Temp(Src) 98.7 F (37.1 C) (Oral)  Resp 16  Ht 6\' 1"  (1.854 m)  Wt 204 lb (92.534 kg)  BMI 26.92 kg/m2  SpO2 98%  Physical Exam  Constitutional: He appears well-developed and well-nourished. No distress.  HENT:  Head: Normocephalic and atraumatic.  Eyes: Conjunctivae are normal.  Neck: Neck supple.  Cardiovascular: Normal rate.   Pulmonary/Chest: Effort normal.  Neurological: He is alert.  Skin: Skin is warm and dry.  Left leg excoriated papules, 3-4 in a row in various areas Right anterior chest: pt has 4 indurated slighty erythematous patches, each approximately 1 cm across. 1 similar lesion behind his right ear  Psychiatric: He has a normal mood  and affect. His behavior is normal.  Nursing note and vitals reviewed.   Assessment & Plan:  Joshua BrunsRonald L Mastro Jr. is a 45 y.o. male Rash and nonspecific skin eruption  Bedbug bite  Scattered bites versus less likely hives on right upper shoulder and neck. Based on distribution and appearance after sleeping, suspected bedbug bites.  -Symptomatic care discussed, triamcinolone cream, Zyrtec over-the-counter once per day as needed, RTC precautions. Other information provided on after visit summary.   No orders of the defined types were placed in this encounter.   Patient Instructions       IF you received an x-ray today, you will receive an invoice from [facility]. Please contact [facility] at [phone] with questions or concerns regarding your invoice.   IF you received labwork today, you will receive an invoice from [facility]. Please contact [facility] at [phone] with questions or concerns regarding your invoice.   Our billing staff will not be able to assist you with questions regarding bills from these companies.  You will be contacted with the lab results as soon as they are available. The fastest way to get your results is to activate your My Chart account. Instructions are located on the last page of this paperwork. If you have not heard from us regarding the results in 2 weeks, please contact this office.     Your rash is concerning for bedbugs or other insect bites. If needed for itching, you can use the steroid cream that was prescribed last visit to these areas up to 3 times per day, continue Zyrtec once per day. I would wash the bed sheets, and have an exterminator evaluate the bed for possible bedbugs. You can also apply a bedbug protector over the mattress for now. Stop the new cologne, and recommend mild soap such as Dove for now. If your rash is not improving into next week, return for recheck, sooner if worse.  Insect  Bite Mosquitoes, flies, fleas, bedbugs, and many other insects can bite. Insect bites are different from insect stings. A sting is when poison (venom) is injected into the skin. Insect bites can cause pain or itching for a few days, but they are usually not serious. Some insects can spread diseases to people through a bite. SYMPTOMS  Symptoms of an insect bite include:  Itching or pain in the bite area.  Redness and swelling in the bite area.  An open wound (skin ulcer). In many cases, symptoms last for 2-4 days.  DIAGNOSIS  This condition is usually diagnosed based on symptoms and a physical exam. TREATMENT  Treatment is usually not needed  for an insect bite. Symptoms often go away on their own. Your health care provider may recommend creams or lotions to help reduce itching. Antibiotic medicines may be prescribed if the bite becomes infected. A tetanus shot may be given in some cases. If you develop an allergic reaction to an insect bite, your health care provider will prescribe medicines to treat the reaction (antihistamines). This is rare. HOME CARE INSTRUCTIONS  Do not scratch the bite area.  Keep the bite area clean and dry. Wash the bite area daily with soap and water as told by your health care provider.  If directed, applyice to the bite area.  Put ice in a plastic bag.  Place a towel between your skin and the bag.  Leave the ice on for 20 minutes, 2-3 times per day.  To help reduce itching and swelling, try applying a baking soda paste, cortisone cream, or calamine lotion to the bite area as told by your health care provider.  Apply or take over-the-counter and prescription medicines only as told by your health care provider.  If you were prescribed an antibiotic medicine, use it as told by your health care provider. Do not stop using the antibiotic even if your condition improves.  Keep all follow-up visits as told by your health care provider. This is  important. PREVENTION   Use insect repellent. The best insect repellents contain:  DEET, picaridin, oil of lemon eucalyptus (OLE), or IR3535.  Higher amounts of an active ingredient.  When you are outdoors, wear clothing that covers your arms and legs.  Avoid opening windows that do not have window screens. SEEK MEDICAL CARE IF:  You have increased redness, swelling, or pain in the bite area.  You have a fever. SEEK IMMEDIATE MEDICAL CARE IF:   You have joint pain.   You have fluid, blood, or pus coming from the bite area.  You have a headache or neck pain.  You have unusual weakness.  You have a rash.  You have chest pain or shortness of breath.  You have abdominal pain, nausea, or vomiting.  You feel unusually tired or sleepy.   This information is not intended to replace advice given to you by your health care provider. Make sure you discuss any questions you have with your health care provider.   Document Released: 07/13/2004 Document Revised: 02/24/2015 Document Reviewed: 10/21/2014 Elsevier Interactive Patient Education 2016 [employer]. Bedbugs Bedbugs are tiny bugs that live in and around beds. They stay hidden during the day, and they come out at night and bite. Bedbugs need blood to live and grow. WHERE ARE BEDBUGS FOUND? Bedbugs can be found anywhere, whether a place is clean or dirty. They are most often found in places where many people come and go, such as hotels, shelters, dorms, and health care settings. It is also common for them to be found in homes where there are many birds or bats nearby. WHAT ARE BEDBUG BITES LIKE? A bedbug bite leaves a small red bump with a darker red dot in the middle. The bump may appear soon after a person is bitten or a day or more later. Bedbug bites usually do not hurt, but they may itch. Most people do not need treatment for bedbug bites. The bumps usually go away on their own in a few days. HOW DO I CHECK FOR  BEDBUGS? Bedbugs are reddish-brown, oval, and flat. They range in size from 1 mm to 7 mm and they cannot fly. Look  for bedbugs in these places:  On mattresses, bed frames, headboards, and box springs.  On drapes and curtains in bedrooms.  Under carpeting in bedrooms.  Behind electrical outlets.  Behind any wallpaper that is peeling.  Inside luggage. Also look for black or red spots or stains on or near the bed. Stains can come from bedbugs that have been crushed or from bedbug waste. WHAT SHOULD I DO IF I FIND BEDBUGS? When Traveling If you find bedbugs while traveling, check all of your possessions carefully before you bring them into your home. Consider throwing away anything that has bedbugs on it. At Home If you find bedbugs at home, your bedroom may need to be treated by a pest control expert. You may also need to throw away mattresses or luggage. To help keep bedbugs from coming back, consider taking these actions:  Put a plastic cover over your mattress.  Wash your clothes and bedding in water that is hotter than 120F (48.9C) and dry them on a hot setting. Bedbugs are killed by high temperatures.  Vacuum often around the bed and in all of the cracks and crevices where the bugs might hide.  Carefully check all used furniture, bedding, or clothes that you bring into your home.  Eliminate bird nests and bat roosts that are near your home. In Your Bed If you find bedbugs in your bed, consider wearing pajamas that have long sleeves and pant legs. Bedbugs usually bite areas of the skin that are not covered.   This information is not intended to replace advice given to you by your health care provider. Make sure you discuss any questions you have with your health care provider.   Document Released: 07/08/2010 Document Revised: 10/20/2014 Document Reviewed: 06/01/2014 Elsevier Interactive Patient Education [employer]2016 Elsevier Inc.    I personally performed the services described  in this documentation, which was scribed in my presence. The recorded information has been reviewed and considered, and addended by me as needed.   Signed,   Joshua StaggersJeffrey Jeovani Weisenburger, MD [facility].  12/10/2015 12:43 PM

## 2015-12-10 NOTE — Patient Instructions (Addendum)
IF you received an x-ray today, you will receive an invoice from [facility]. Please contact [facility] at [phone] with questions or concerns regarding your invoice.   IF you received labwork today, you will receive an invoice from [facility]. Please contact [facility] at [phone] with questions or concerns regarding your invoice.   Our billing staff will not be able to assist you with questions regarding bills from these companies.  You will be contacted with the lab results as soon as they are available. The fastest way to get your results is to activate your My Chart account. Instructions are located on the last page of this paperwork. If you have not heard from us regarding the results in 2 weeks, please contact this office.     Your rash is concerning for bedbugs or other insect bites. If needed for itching, you can use the steroid cream that was prescribed last visit to these areas up to 3 times per day, continue Zyrtec once per day. I would wash the bed sheets, and have an exterminator evaluate the bed for possible bedbugs. You can also apply a bedbug protector over the mattress for now. Stop the new cologne, and recommend mild soap such as Dove for now. If your rash is not improving into next week, return for recheck, sooner if worse.  Insect Bite Mosquitoes, flies, fleas, bedbugs, and many other insects can bite. Insect bites are different from insect stings. A sting is when poison (venom) is injected into the skin. Insect bites can cause pain or itching for a few days, but they are usually not serious. Some insects can spread diseases to people through a bite. SYMPTOMS  Symptoms of an insect bite include:  Itching or pain in the bite area.  Redness and swelling in the bite area.  An open wound (skin ulcer). In many cases, symptoms last for 2-4 days.  DIAGNOSIS  This condition is usually diagnosed based on symptoms and a  physical exam. TREATMENT  Treatment is usually not needed for an insect bite. Symptoms often go away on their own. Your health care provider may recommend creams or lotions to help reduce itching. Antibiotic medicines may be prescribed if the bite becomes infected. A tetanus shot may be given in some cases. If you develop an allergic reaction to an insect bite, your health care provider will prescribe medicines to treat the reaction (antihistamines). This is rare. HOME CARE INSTRUCTIONS  Do not scratch the bite area.  Keep the bite area clean and dry. Wash the bite area daily with soap and water as told by your health care provider.  If directed, applyice to the bite area.  Put ice in a plastic bag.  Place a towel between your skin and the bag.  Leave the ice on for 20 minutes, 2-3 times per day.  To help reduce itching and swelling, try applying a baking soda paste, cortisone cream, or calamine lotion to the bite area as told by your health care provider.  Apply or take over-the-counter and prescription medicines only as told by your health care provider.  If you were prescribed an antibiotic medicine, use it as told by your health care provider. Do not stop using the antibiotic even if your condition improves.  Keep all follow-up visits as told by your health care provider. This is important. PREVENTION   Use insect repellent. The best insect repellents contain:  DEET, picaridin, oil of lemon eucalyptus (OLE), or IR3535.  Higher amounts of an active ingredient.  When you are outdoors, wear clothing that covers your arms and legs.  Avoid opening windows that do not have window screens. SEEK MEDICAL CARE IF:  You have increased redness, swelling, or pain in the bite area.  You have a fever. SEEK IMMEDIATE MEDICAL CARE IF:   You have joint pain.   You have fluid, blood, or pus coming from the bite area.  You have a headache or neck pain.  You have unusual  weakness.  You have a rash.  You have chest pain or shortness of breath.  You have abdominal pain, nausea, or vomiting.  You feel unusually tired or sleepy.   This information is not intended to replace advice given to you by your health care provider. Make sure you discuss any questions you have with your health care provider.   Document Released: 07/13/2004 Document Revised: 02/24/2015 Document Reviewed: 10/21/2014 Elsevier Interactive Patient Education 2016 [employer]. Bedbugs Bedbugs are tiny bugs that live in and around beds. They stay hidden during the day, and they come out at night and bite. Bedbugs need blood to live and grow. WHERE ARE BEDBUGS FOUND? Bedbugs can be found anywhere, whether a place is clean or dirty. They are most often found in places where many people come and go, such as hotels, shelters, dorms, and health care settings. It is also common for them to be found in homes where there are many birds or bats nearby. WHAT ARE BEDBUG BITES LIKE? A bedbug bite leaves a small red bump with a darker red dot in the middle. The bump may appear soon after a person is bitten or a day or more later. Bedbug bites usually do not hurt, but they may itch. Most people do not need treatment for bedbug bites. The bumps usually go away on their own in a few days. HOW DO I CHECK FOR BEDBUGS? Bedbugs are reddish-brown, oval, and flat. They range in size from 1 mm to 7 mm and they cannot fly. Look for bedbugs in these places:  On mattresses, bed frames, headboards, and box springs.  On drapes and curtains in bedrooms.  Under carpeting in bedrooms.  Behind electrical outlets.  Behind any wallpaper that is peeling.  Inside luggage. Also look for black or red spots or stains on or near the bed. Stains can come from bedbugs that have been crushed or from bedbug waste. WHAT SHOULD I DO IF I FIND BEDBUGS? When Traveling If you find bedbugs while traveling, check all of your  possessions carefully before you bring them into your home. Consider throwing away anything that has bedbugs on it. At Home If you find bedbugs at home, your bedroom may need to be treated by a pest control expert. You may also need to throw away mattresses or luggage. To help keep bedbugs from coming back, consider taking these actions:  Put a plastic cover over your mattress.  Wash your clothes and bedding in water that is hotter than 120F (48.9C) and dry them on a hot setting. Bedbugs are killed by high temperatures.  Vacuum often around the bed and in all of the cracks and crevices where the bugs might hide.  Carefully check all used furniture, bedding, or clothes that you bring into your home.  Eliminate bird nests and bat roosts that are near your home. In Your Bed If you find bedbugs in your bed, consider wearing pajamas that have long sleeves and pant legs. Bedbugs usually  bite areas of the skin that are not covered.   This information is not intended to replace advice given to you by your health care provider. Make sure you discuss any questions you have with your health care provider.   Document Released: 07/08/2010 Document Revised: 10/20/2014 Document Reviewed: 06/01/2014 Elsevier Interactive Patient Education [employer]2016 Elsevier Inc.

## 2016-04-02 ENCOUNTER — Emergency Department (HOSPITAL_COMMUNITY)
Admission: EM | Admit: 2016-04-02 | Discharge: 2016-04-03 | Disposition: A | Discharge status: Home / Self Care | Payer: 59 | Attending: Emergency Medicine | Admitting: Emergency Medicine

## 2016-04-02 ENCOUNTER — Encounter (HOSPITAL_COMMUNITY): Payer: Self-pay | Admitting: Emergency Medicine

## 2016-04-02 DIAGNOSIS — I10 Essential (primary) hypertension: Secondary | ICD-10-CM | POA: Diagnosis not present

## 2016-04-02 DIAGNOSIS — F17210 Nicotine dependence, cigarettes, uncomplicated: Secondary | ICD-10-CM | POA: Insufficient documentation

## 2016-04-02 DIAGNOSIS — Z79899 Other long term (current) drug therapy: Secondary | ICD-10-CM | POA: Diagnosis not present

## 2016-04-02 DIAGNOSIS — R1310 Dysphagia, unspecified: Secondary | ICD-10-CM | POA: Insufficient documentation

## 2016-04-02 DIAGNOSIS — R070 Pain in throat: Secondary | ICD-10-CM | POA: Diagnosis present

## 2016-04-02 NOTE — ED Triage Notes (Signed)
Pt from home with complaints of having ham from breakfast stuck in his throat. Pt states this has happened before about a month ago with a hamburger. Pt states last time this happened it went away about 3 weeks after it happened. Pt has clear lung sounds and and is maintaining his oxygen at 99% room air. Pt has an otherwise unremarkable neuro exam

## 2016-04-02 NOTE — ED Notes (Signed)
Pt tolerating po fluids well---- pt able to drink and keep coca-cola down, but still complaining of "food stuck in his throat".

## 2016-04-03 ENCOUNTER — Emergency Department (HOSPITAL_COMMUNITY): Payer: 59

## 2016-04-03 MED ORDER — GI COCKTAIL ~~LOC~~
30.0000 mL | Freq: Once | ORAL | Status: AC
  Administered 2016-04-03: 30 mL via ORAL
  Filled 2016-04-03: qty 30

## 2016-04-03 MED ORDER — PANTOPRAZOLE SODIUM 20 MG PO TBEC: 20.0000 mg | DELAYED_RELEASE_TABLET | Freq: Every day | ORAL | 0 refills | Status: DC

## 2016-04-03 NOTE — ED Provider Notes (Signed)
[facility]-EMERGENCY DEPT Provider Note   [name]: [id number] Arrival date & time: 04/02/16  2303   By signing my name below, I, Valentino SaxonBianca Contreras, attest that this documentation has been prepared under the direction and in the presence of  Landon Truax PA-C. Electronically Signed: Valentino SaxonBianca Contreras, ED Scribe. 04/03/16. 1:41 AM.  History   Chief Complaint Chief Complaint  Patient presents with  . possible foreign body    The history is provided by the patient. No language interpreter was used.   HPI Comments: Joshua BrunsRonald L Garverick Jr. is a 45 y.o. male who presents to the Emergency Department complaining of mild, constant, throat pain onset yesterday morning, that has gradually improved, but not yet resolved.  His sx began at 9 am yesterday when eating breakfast and he felt sensation of food being stuck in his throat. He described his pain in throat as "flaring" and having a sensation of food being "stuck" and that "his windpipe feels like it's being cut off.  He is able to swallow solids and liquids, no vomiting, his voice is a little "scratchy". He denies SOB, CP. Per pt, he also notes that he had a similar episode ~a month ago after eating a hamburger. He reports similar symptoms and complained of having a raspy voice after foreign body was stuck in his throat. No alleviating factors noted. He denies vomiting, trouble swallowing fluids, trouble breathing. He notes having a Phx of gastroenteritis. Pt was prescribed an acid reflux medication which he took but has recently discontinued. No additional complaints at this time.   Past Medical History:  Diagnosis Date  . Anxiety   . ANXIETY 06/03/2007  . Facial numbness 2004   ?small vessel disease; (-) hyper-coag profile  . GERD 06/03/2007  . GERD (gastroesophageal reflux disease)   . Hypertension   . HYPERTENSION 11/22/2006  . Migraine   . MIGRAINE HEADACHE 11/22/2006  . Rash 10/18/2010    Patient Active Problem List   Diagnosis Date Noted  . Bursitis,  prepatellar 03/09/2011  . Rash 10/18/2010  . ANXIETY 06/03/2007  . GERD 06/03/2007  . MIGRAINE HEADACHE 11/22/2006  . Essential hypertension 11/22/2006    Past Surgical History:  Procedure Laterality Date  . APPENDECTOMY         Home Medications    Prior to Admission medications   Medication Sig Start Date End Date Taking? Authorizing Provider  hydrochlorothiazide (HYDRODIURIL) 25 MG tablet Take 1 tablet (25 mg total) by mouth daily. 10/17/15  Yes Sherren MochaEva N Shaw, MD  Hyprom-Naphaz-Polysorb-Zn Sulf (CLEAR EYES COMPLETE OP) Apply 1 drop to eye daily as needed (dry eyes).   Yes Historical Provider, MD  telmisartan (MICARDIS) 80 MG tablet TAKE 1 TABLET (80 MG) BY MOUTH DAILY. 10/17/15  Yes Sherren MochaEva N Shaw, MD  verapamil (CALAN-SR) 240 MG CR tablet TAKE 1 TABLET (240 MG) BY MOUTH AT BEDTIME. 10/17/15  Yes Sherren MochaEva N Shaw, MD  triamcinolone cream (KENALOG) 0.1 % Apply 1 application topically 2 (two) times daily. Patient not taking: Reported on 04/02/2016 12/06/15   Peyton Najjaravid H Hopper, MD    Family History Family History  Problem Relation Age of Onset  . Hypertension Father   . Diabetes Father   . Hypertension Mother     Social History Social History  Substance Use Topics  . Smoking status: Current Every Day Smoker    Packs/day: 0.25    Types: Cigarettes  . Smokeless tobacco: Never Used  . Alcohol use 0.0 oz/week     Comment: socially  Allergies   Review of patient's allergies indicates no known allergies.   Review of Systems Review of Systems  All other systems reviewed and are negative.   10 Systems reviewed and all are negative for acute change except as noted in the HPI.  Physical Exam Updated Vital Signs BP 141/87 (BP Location: Left Arm)   Pulse 78   Temp 98.7 F (37.1 C) (Oral)   Resp 20   Ht 6' 1.5" (1.867 m)   Wt 221 lb (100.2 kg)   SpO2 99%   BMI 28.76 kg/m   Physical Exam  Constitutional: He is oriented to person, place, and time. He appears well-developed and  well-nourished. No distress.  HENT:  Head: Normocephalic and atraumatic.  Right Ear: External ear normal.  Left Ear: External ear normal.  Nose: Nose normal.  Mouth/Throat: Oropharynx is clear and moist. No oropharyngeal exudate.  Eyes: Conjunctivae and EOM are normal. Pupils are equal, round, and reactive to light. Right eye exhibits no discharge. Left eye exhibits no discharge. No scleral icterus.  Neck: Normal range of motion. Neck supple. No JVD present. No tracheal deviation present. No thyromegaly present.  Cardiovascular: Normal rate, regular rhythm and normal heart sounds.  Exam reveals no gallop and no friction rub.   No murmur heard. Pulmonary/Chest: Effort normal and breath sounds normal. No stridor. No respiratory distress. He has no wheezes. He has no rales. He exhibits no tenderness.  Abdominal: Soft. Bowel sounds are normal. He exhibits no distension and no mass. There is no tenderness. There is no rebound and no guarding. No hernia.  Musculoskeletal: Normal range of motion. He exhibits no edema.  Lymphadenopathy:    He has no cervical adenopathy.  Neurological: He is alert and oriented to person, place, and time. He exhibits normal muscle tone. Coordination normal.  Skin: Skin is warm and dry. Capillary refill takes less than 2 seconds. No rash noted. He is not diaphoretic. No erythema. No pallor.  Psychiatric: He has a normal mood and affect. His behavior is normal. Judgment and thought content normal.  Nursing note and vitals reviewed.    ED Treatments / Results   DIAGNOSTIC STUDIES: Oxygen Saturation is 99% on RA, normal by my interpretation.    COORDINATION OF CARE: 1:16 AM Discussed treatment plan with pt at bedside which includes Neck XR and GI cocktail and pt agreed to plan.   Labs (all labs ordered are listed, but only abnormal results are displayed) Labs Reviewed - No data to display  Results for orders placed or performed in visit on 10/17/15  Basic  metabolic panel  Result Value Ref Range   Sodium 138 135 - 146 mmol/L   Potassium 4.6 3.5 - 5.3 mmol/L   Chloride 105 98 - 110 mmol/L   CO2 24 20 - 31 mmol/L   Glucose, Bld 106 (H) 65 - 99 mg/dL   BUN 22 7 - 25 mg/dL   Creat 1.61 0.96 - 0.45 mg/dL   Calcium 9.9 8.6 - 40.9 mg/dL   Dg Neck Soft Tissue  Result Date: 04/03/2016 CLINICAL DATA:  45 y/o M; throat discomfort with a sensation of something being stuck. EXAM: NECK SOFT TISSUES - 1+ VIEW COMPARISON:  None. FINDINGS: No retropharyngeal soft tissue thickening or epiglottic enlargement. Cervical spondylosis with prominent anterior osteophytes at C3 through C5. 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 No acute osseous abnormality is evident. 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 Electronically Signed   By: Mitzi Hansen M.D.   On: 04/03/2016 02:11    EKG  EKG Interpretation None       Radiology No results found.  Procedures Procedures (including critical care time)  Medications Ordered in ED Medications  gi cocktail (Maalox,Lidocaine,Donnatal) (30 mLs Oral Given 04/03/16 0127)     Initial Impression / Assessment and Plan / ED Course  I have reviewed the triage vital signs and the nursing notes.  Pertinent labs & imaging results that were available during my care of the patient were reviewed by me and considered in my medical decision making (see chart for details).  Clinical Course   Pt with recurrent dysphagia, gradually improving, airway intact, neck normal, phonation normal, able to swallow liquids.  Neck plain films obtained, significant for stylohyoid ossification, may be contributing to his recurrent sx.  Will refer to ENT for eval.  Pt appears stable to d/c, tolerating PO's, airway intact, VSS.  D/c home with PPI  Final  Clinical Impressions(s) / ED Diagnoses   Final diagnoses:  Dysphagia, unspecified type    New Prescriptions Discharge Medication List as of 04/03/2016  2:30 AM    START taking these medications   Details  pantoprazole (PROTONIX) 20 MG tablet Take 1 tablet (20 mg total) by mouth daily., Starting Mon 04/03/2016, Print       I personally performed the services described in this documentation, which was scribed in my presence. The recorded information has been reviewed and is accurate.       Danelle Berry, PA-C 04/22/16 1724    Jacalyn Lefevre, MD 04/23/16 [phone]

## 2016-09-27 ENCOUNTER — Emergency Department (HOSPITAL_COMMUNITY)
Admission: EM | Admit: 2016-09-27 | Discharge: 2016-09-27 | Disposition: A | Discharge status: Home / Self Care | Payer: PRIVATE HEALTH INSURANCE | Attending: Emergency Medicine | Admitting: Emergency Medicine

## 2016-09-27 ENCOUNTER — Encounter (HOSPITAL_COMMUNITY): Payer: Self-pay | Admitting: *Deleted

## 2016-09-27 ENCOUNTER — Emergency Department (HOSPITAL_COMMUNITY): Payer: PRIVATE HEALTH INSURANCE

## 2016-09-27 DIAGNOSIS — I10 Essential (primary) hypertension: Secondary | ICD-10-CM | POA: Insufficient documentation

## 2016-09-27 DIAGNOSIS — F17210 Nicotine dependence, cigarettes, uncomplicated: Secondary | ICD-10-CM | POA: Insufficient documentation

## 2016-09-27 DIAGNOSIS — Z79899 Other long term (current) drug therapy: Secondary | ICD-10-CM | POA: Insufficient documentation

## 2016-09-27 DIAGNOSIS — R072 Precordial pain: Secondary | ICD-10-CM

## 2016-09-27 LAB — BASIC METABOLIC PANEL
ANION GAP: 8 (ref 5–15)
BUN: 24 mg/dL — ABNORMAL HIGH (ref 6–20)
CALCIUM: 9.2 mg/dL (ref 8.9–10.3)
CHLORIDE: 105 mmol/L (ref 101–111)
CO2: 24 mmol/L (ref 22–32)
CREATININE: 1.3 mg/dL — AB (ref 0.61–1.24)
GFR calc Af Amer: >60 mL/min (ref >60)
GFR calc non Af Amer: >60 mL/min (ref >60)
GLUCOSE: 121 mg/dL — AB (ref 65–99)
Potassium: 4.1 mmol/L (ref 3.5–5.1)
Sodium: 137 mmol/L (ref 135–145)

## 2016-09-27 LAB — CBC
HCT: 39.9 % (ref 39.0–52.0)
HEMOGLOBIN: 13.6 g/dL (ref 13.0–17.0)
MCH: 26 pg (ref 26.0–34.0)
MCHC: 34.1 g/dL (ref 30.0–36.0)
MCV: 76.3 fL — AB (ref 78.0–100.0)
Platelets: 262 10*3/uL (ref 150–400)
RBC: 5.23 MIL/uL (ref 4.22–5.81)
RDW: 14.5 % (ref 11.5–15.5)
WBC: 3.5 10*3/uL — ABNORMAL LOW (ref 4.0–10.5)

## 2016-09-27 LAB — I-STAT TROPONIN, ED
TROPONIN I, POC: 0 ng/mL (ref 0.00–0.08)
Troponin i, poc: 0 ng/mL (ref 0.00–0.08)

## 2016-09-27 MED ORDER — RANITIDINE HCL 150 MG PO TABS: 150.0000 mg | ORAL_TABLET | Freq: Two times a day (BID) | ORAL | 0 refills | Status: DC

## 2016-09-27 MED ORDER — PANTOPRAZOLE SODIUM 20 MG PO TBEC
40.0000 mg | DELAYED_RELEASE_TABLET | Freq: Every day | ORAL | 0 refills | Status: DC
Start: 2016-09-27 — End: 2017-12-23

## 2016-09-27 NOTE — ED Triage Notes (Signed)
Pt here for left sided chest pain with some pain radiating to left arm.  No sob with this, no n/v or diaphoresis with this.  Pt has hx of htn and is taking meds as prescribed.

## 2016-09-27 NOTE — Discharge Instructions (Signed)
Your lab work, chest x-ray and EKG were normal and reassuring today.  It is very unlikely that you will develop a cardiac complication within 6 weeks of today.  However, I recommend that you set an appointment with a cardiologist within 1 week for further outpatient cardiac work up.  Some chest discomfort may be caused by acid reflux, so you have been prescribed protonix and ranitidine for reflux symptoms, please take these as prescribed and make note if your symptoms resolve.    Please return to ED if your symptoms worsen or are associated with shortness of breath, nausea, vomiting, sweating or become concerning in any other way.

## 2016-09-27 NOTE — ED Provider Notes (Signed)
[facility]-EMERGENCY DEPT Provider Note   [name]: [id number] Arrival date & time: 09/27/16  1019     History   Chief Complaint Chief Complaint  Patient presents with  . Chest Pain    HPI [name]. is a 46 y.o. male with pmh of HTN, GERD and HAs presents to the ED reporting substernal chest discomfort described as "tightness" that radiates to the left upper abdomen, left arm and left leg. Patient denies pattern to these symptoms, reports that they have been intermittent for the past 3 weeks and occasionally occur at rest, upon awakening and while walking or just sitting down at work. Associated symptoms include intermittent palpitations which patient notes have been present for a while.  Patient had heart monitor for palpitations in the past but cannot tell me what the results were. No associated shortness of breath, nausea, vomiting, diaphoresis.  Patient smokes 5 cigars daily and drinks 2-3 beers daily. Patient has hypertension and takes blood pressure medications daily for this. Of note, patient states that his chest discomfort is similar to previous episodes he had when he had an injury to his left pectoralis muscle. 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  No recent fevers, URI illnesses, previous h/o DVT/PE, family h/o of early onset heard disease.   HPI  Past Medical History:  Diagnosis Date  . Anxiety   . ANXIETY 06/03/2007  . Facial numbness 2004   ?small vessel disease; (-) hyper-coag profile  . GERD 06/03/2007  . GERD (gastroesophageal reflux disease)   . Hypertension   . HYPERTENSION 11/22/2006  . Migraine   . MIGRAINE HEADACHE 11/22/2006  . Rash 10/18/2010    Patient Active Problem List   Diagnosis Date Noted  . Bursitis, prepatellar 03/09/2011  . Rash 10/18/2010  . ANXIETY 06/03/2007  . GERD 06/03/2007  . MIGRAINE HEADACHE 11/22/2006  . Essential hypertension 11/22/2006    Past  Surgical History:  Procedure Laterality Date  . APPENDECTOMY         Home Medications    Prior to Admission medications   Medication Sig Start Date End Date Taking? Authorizing Provider  hydrochlorothiazide (HYDRODIURIL) 25 MG tablet Take 1 tablet (25 mg total) by mouth daily. 10/17/15  Yes Sherren Mocha, MD  omeprazole (PRILOSEC) 40 MG capsule Take 40 mg by mouth 2 (two) times daily.   Yes Historical Provider, MD  telmisartan (MICARDIS) 80 MG tablet TAKE 1 TABLET (80 MG) BY MOUTH DAILY. Patient taking differently: Take 80 mg by mouth daily with breakfast.  10/17/15  Yes Sherren Mocha, MD  verapamil (CALAN-SR) 240 MG CR tablet TAKE 1 TABLET (240 MG) BY MOUTH AT BEDTIME. Patient taking differently: Take 240 mg by mouth daily with breakfast.  10/17/15  Yes Sherren Mocha, MD  Hyprom-Naphaz-Polysorb-Zn Sulf (CLEAR EYES COMPLETE OP) Apply 1 drop to eye daily as needed (dry eyes).    Historical Provider, MD  pantoprazole (PROTONIX) 20 MG tablet Take 2 tablets (40 mg total) by mouth daily. 09/27/16   Liberty Handy, PA-C  ranitidine (ZANTAC) 150 MG tablet Take 1 tablet (150 mg total) by mouth 2 (two) times daily. 09/27/16   Liberty Handy, PA-C  triamcinolone cream (KENALOG) 0.1 % Apply 1 application topically 2 (two) times daily. Patient not taking: Reported on 04/02/2016 12/06/15   Peyton Najjar, MD    Family History Family History  Problem Relation  Age of Onset  . Hypertension Father   . Diabetes Father   . Hypertension Mother     Social History Social History  Substance Use Topics  . Smoking status: Current Every Day Smoker    Packs/day: 0.25    Types: Cigarettes  . Smokeless tobacco: Never Used  . Alcohol use 0.0 oz/week     Comment: socially     Allergies   Patient has no known allergies.   Review of Systems Review of Systems  Constitutional: Negative for chills and fever.  HENT: Negative for congestion.   Respiratory: Positive for chest tightness. Negative for cough,  choking and shortness of breath.   Cardiovascular: Positive for chest pain and palpitations.  Gastrointestinal: Positive for abdominal pain. Negative for constipation, diarrhea and vomiting.  Genitourinary: Negative for difficulty urinating, dysuria and hematuria.  Musculoskeletal: Negative for arthralgias, back pain and joint swelling.  Skin: Negative for wound.  Neurological: Positive for numbness. Negative for light-headedness and headaches.     Physical Exam Updated Vital Signs BP (!) 143/92   Pulse 73   Temp 98.3 F (36.8 C) (Oral)   Resp 14   Ht 6' (1.829 m)   Wt 99.8 kg   SpO2 97%   BMI 29.84 kg/m   Physical Exam  Constitutional: He is oriented to person, place, and time. He appears well-developed and well-nourished. No distress.  HENT:  Head: Normocephalic and atraumatic.  Nose: Nose normal.  Mouth/Throat: Oropharynx is clear and moist. No oropharyngeal exudate.  Eyes: Conjunctivae and EOM are normal. Pupils are equal, round, and reactive to light.  Neck: Normal range of motion. Neck supple.  Trachea midline  Cardiovascular:  SBP 150/94. HR within normal limits  No JVD with head of bed at 30 degrees. Carotid pulses 2+ bilaterally without bruits. Good S1 and S2. No extra sounds or murmurs. 2+ and symmetric radial, dorsalis pedis and posterior tibial pulses bilaterally.   Capillary refill brisk in upper extremities.  No varicosities seen. No lower extremity edema.  Patient denied shortness of breath, throat congestion or difficulty breathing with head of the bed flat. No orthopnea.  Good lung sounds without crackles.  Pulmonary/Chest:  RR within normal limits. SpO2 within normal limits.  Normal breathing effort. Patient speaking in full sentences. No pursed lip breathing. No chest wall retractions. No cyanosis. Chest wall expansion symmetric.  No chest wall tenderness. Lungs CTAB anteriorly and posteriorly without wheezing, rhonchi or crackles.  No egophony.  No  JVD. No orthopnea with head of bed flat.   Abdominal: Soft. Bowel sounds are normal. He exhibits no distension. There is no tenderness.  No epigastric abdominal tenderness  Musculoskeletal: Normal range of motion. He exhibits no deformity.  Chest wall non tender.  Full active ROM of UEs without reported pain  Lymphadenopathy:    He has no cervical adenopathy.  Neurological: He is alert and oriented to person, place, and time.  Skin: Skin is warm and dry. Capillary refill takes less than 2 seconds.  Psychiatric: He has a normal mood and affect. His behavior is normal. Judgment and thought content normal.  Nursing note and vitals reviewed.    ED Treatments / Results  Labs (all labs ordered are listed, but only abnormal results are displayed) Labs Reviewed  BASIC METABOLIC PANEL - Abnormal; Notable for the following:       Result Value   Glucose, Bld 121 (*)    BUN 24 (*)    Creatinine, Ser 1.30 (*)  All other components within normal limits  CBC - Abnormal; Notable for the following:    WBC 3.5 (*)    MCV 76.3 (*)    All other components within normal limits  I-STAT TROPOININ, ED  I-STAT TROPOININ, ED    EKG  EKG Interpretation  Date/Time:  Wednesday September 27 2016 10:27:42 EDT Ventricular Rate:  95 PR Interval:    QRS Duration: 75 QT Interval:  321 QTC Calculation: 404 R Axis:   37 Text Interpretation:  Sinus rhythm Probable left atrial enlargement No STEMI Confirmed by Rush Landmark MD, CHRISTOPHER [id number]) on 09/27/2016 12:24:04 PM       Radiology Dg Chest 2 View  Result Date: 09/27/2016 CLINICAL DATA:  Mid chest pain extending into the left arm for 3 weeks. EXAM: CHEST  2 VIEW COMPARISON:  Two-view chest x-ray 05/12/2015 FINDINGS: The heart size and mediastinal contours are within normal limits. Both lungs are clear. The visualized skeletal structures are unremarkable. IMPRESSION: Negative two view chest x-ray Electronically Signed   By: Marin Roberts M.D.   On:  09/27/2016 10:55    Procedures Procedures (including critical care time)  Medications Ordered in ED Medications - No data to display   Initial Impression / Assessment and Plan / ED Course  I have reviewed the triage vital signs and the nursing notes.  Pertinent labs & imaging results that were available during my care of the patient were reviewed by me and considered in my medical decision making (see chart for details).  Clinical Course as of Sep 27 1533  Wed Sep 27, 2016  1127 FINDINGS: The heart size and mediastinal contours are within normal limits. Both lungs are clear. The visualized skeletal structures are unremarkable.  IMPRESSION: Negative two view chest x-ray DG Chest 2 View [CG]  1128 Nonischemic EKG 12-Lead [CG]  1128 Hemoglobin: 13.6 [CG]  1128 Potassium: 4.1 [CG]  1128 Sodium: 137 [CG]  1128 Creatinine: (!) 1.30 [CG]  1128 BUN: (!) 24 [CG]  1128 Troponin i, poc: 0.00 [CG]  1128 Temp: 98.1 F (36.7 C) [CG]  1128 Pulse Rate: 94 [CG]  1128 BP: (!) 155/101 [CG]  1128 Resp: 18 [CG]  1128 SpO2: 99 % [CG]  1428 Troponin i, poc: 0.00 [CG]    Clinical Course User Index [CG] Liberty Handy, PA-C   Pt is a 46 y.o. male presents with non exertional CP x 3 weeks. Pertinent risk factors include HTN, obesity, smoking.  On exam VS are within normal limits, systolic blood pressure elevated 140-150. RRR, symmetric pulses bilaterally, trach midline. CBC, BMP, troponin x 2, CXR, EKG within normal limits.  Heart score = 1.  PERC negative. Patient was asymptomatic in ED did not receive any medications in ED. Patient is to be discharged with recommendation to follow up with PCP and cardiologist in regards to today's hospital visit. It is possible that chest pain could be due to cardiac etiology however given presentation, reassuring work up and low risk HEART score, patient is low risk and is safe for discharge for outpatient work up.  Patient also has h/o untreated GERD and left  pectoralis muscle tear. He did report that his chest discomfort with radiation to the left arm is similar in character to previous left pectoralis muscle tear symptoms.  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 Will discharge with protonix/ranitidine.  Pt has been advised to return to the ED  if CP becomes exertional, associated with diaphoresis or nausea, radiates to left jaw/arm, worsens or becomes concerning in any way. Pt appears reliable for follow up and is agreeable to discharge.   Final Clinical Impressions(s) / ED Diagnoses   Final diagnoses:  Precordial pain    New Prescriptions Discharge Medication List as of 09/27/2016  2:41 PM    START taking these medications   Details  ranitidine (ZANTAC) 150 MG tablet Take 1 tablet (150 mg total) by mouth 2 (two) times daily., Starting Wed 09/27/2016, Print         Liberty Handy, PA-C 09/27/16 1538    Canary Brim Tegeler, MD 09/27/16 1904

## 2016-09-27 NOTE — ED Notes (Signed)
Hx of Acid Reflux. Takes 40 mg antacid daily

## 2016-10-07 ENCOUNTER — Other Ambulatory Visit: Payer: Self-pay | Admitting: Family Medicine

## 2016-10-08 ENCOUNTER — Telehealth: Payer: Self-pay | Admitting: Family Medicine

## 2016-10-08 ENCOUNTER — Other Ambulatory Visit: Payer: Self-pay | Admitting: Physician Assistant

## 2016-10-09 ENCOUNTER — Other Ambulatory Visit: Payer: Self-pay | Admitting: Family Medicine

## 2016-10-10 NOTE — Telephone Encounter (Signed)
Pt needs an OV for any further refills - recent Cr bump, ER visit for CP, and BP not well controlled. Has not been seen for BP in 1 yr.

## 2016-11-06 ENCOUNTER — Other Ambulatory Visit: Payer: Self-pay | Admitting: Family Medicine

## 2016-11-07 NOTE — Telephone Encounter (Signed)
Pt is long-overdue for a f/u OV. Please have him sched an appt within the next WEEK so we can continue to make sure the medicines are keeping her healthy and safe before we refill them further. Thanks. I requested pt sched for an OV last mo when I gave a 1 mo refill - sent phone note to sched pool but nothing documented/scheduled. Also placed reqest on refill label.

## 2016-11-10 ENCOUNTER — Telehealth: Payer: Self-pay | Admitting: Family Medicine

## 2016-11-10 NOTE — Telephone Encounter (Signed)
Dr Clelia CroftShaw I called pt about coming into the office for f/u on medication refill and labs he was overdue for a f/u on his verapamil and HCTZ  I also Spoke with pt about bad debt of $ 579.80 and to set up payment arrangement so he can be seen because of our policy about balance and Bad Debts I gave him Pauls name he states that he was very upset when he came into office and someone told him that he couldn't be seen until Bad Debt is paid off in front of everyone in the lobby he states that he will not return back to our office after 12 years I apologized about the situation again I told him if he change his mind about coming into office he could speak with Renae FicklePaul in billing -Antonietta JewelAngie Ramesh Moan

## 2016-11-12 NOTE — Telephone Encounter (Signed)
Noted. I've only seen pt once prior and it was > 1 yr ago.  He established with our office w/ Dr. Neva SeatGreene in 2014 when he transfered [place]az [place]. Forwarded note to [place] and Laura as FYI.

## 2017-05-04 NOTE — Telephone Encounter (Signed)
done

## 2017-12-23 ENCOUNTER — Encounter (HOSPITAL_COMMUNITY): Payer: Self-pay

## 2017-12-23 ENCOUNTER — Other Ambulatory Visit: Payer: Self-pay

## 2017-12-23 ENCOUNTER — Emergency Department (HOSPITAL_COMMUNITY): Payer: 59

## 2017-12-23 ENCOUNTER — Emergency Department (HOSPITAL_COMMUNITY)
Admission: EM | Admit: 2017-12-23 | Discharge: 2017-12-23 | Disposition: A | Discharge status: Home / Self Care | Payer: 59 | Attending: Emergency Medicine | Admitting: Emergency Medicine

## 2017-12-23 DIAGNOSIS — I10 Essential (primary) hypertension: Secondary | ICD-10-CM | POA: Insufficient documentation

## 2017-12-23 DIAGNOSIS — R202 Paresthesia of skin: Secondary | ICD-10-CM

## 2017-12-23 DIAGNOSIS — Z79899 Other long term (current) drug therapy: Secondary | ICD-10-CM | POA: Insufficient documentation

## 2017-12-23 DIAGNOSIS — F17210 Nicotine dependence, cigarettes, uncomplicated: Secondary | ICD-10-CM | POA: Insufficient documentation

## 2017-12-23 DIAGNOSIS — R200 Anesthesia of skin: Secondary | ICD-10-CM | POA: Diagnosis present

## 2017-12-23 LAB — URINALYSIS, ROUTINE W REFLEX MICROSCOPIC
Bilirubin Urine: NEGATIVE
Glucose, UA: NEGATIVE mg/dL
Hgb urine dipstick: NEGATIVE
Ketones, ur: NEGATIVE mg/dL
LEUKOCYTES UA: NEGATIVE
NITRITE: NEGATIVE
PROTEIN: NEGATIVE mg/dL
Specific Gravity, Urine: 1.012 (ref 1.005–1.030)
pH: 5 (ref 5.0–8.0)

## 2017-12-23 LAB — COMPREHENSIVE METABOLIC PANEL
ALBUMIN: 4 g/dL (ref 3.5–5.0)
ALT: 35 U/L (ref 0–44)
AST: 23 U/L (ref 15–41)
Alkaline Phosphatase: 52 U/L (ref 38–126)
Anion gap: 6 (ref 5–15)
BUN: 18 mg/dL (ref 6–20)
CHLORIDE: 105 mmol/L (ref 98–111)
CO2: 28 mmol/L (ref 22–32)
CREATININE: 1.22 mg/dL (ref 0.61–1.24)
Calcium: 9.3 mg/dL (ref 8.9–10.3)
GFR calc non Af Amer: >60 mL/min (ref >60)
GLUCOSE: 128 mg/dL — AB (ref 70–99)
Potassium: 3.9 mmol/L (ref 3.5–5.1)
SODIUM: 139 mmol/L (ref 135–145)
Total Bilirubin: 0.3 mg/dL (ref 0.3–1.2)
Total Protein: 7.1 g/dL (ref 6.5–8.1)

## 2017-12-23 LAB — CBC WITH DIFFERENTIAL/PLATELET
Basophils Absolute: 0 10*3/uL (ref 0.0–0.1)
Basophils Relative: 0 %
EOS ABS: 0 10*3/uL (ref 0.0–0.7)
Eosinophils Relative: 1 %
HCT: 38.5 % — ABNORMAL LOW (ref 39.0–52.0)
HEMOGLOBIN: 12.9 g/dL — AB (ref 13.0–17.0)
LYMPHS ABS: 1.5 10*3/uL (ref 0.7–4.0)
Lymphocytes Relative: 31 %
MCH: 25.8 pg — AB (ref 26.0–34.0)
MCHC: 33.5 g/dL (ref 30.0–36.0)
MCV: 77 fL — ABNORMAL LOW (ref 78.0–100.0)
MONOS PCT: 9 %
Monocytes Absolute: 0.4 10*3/uL (ref 0.1–1.0)
NEUTROS PCT: 59 %
Neutro Abs: 2.8 10*3/uL (ref 1.7–7.7)
Platelets: 309 10*3/uL (ref 150–400)
RBC: 5 MIL/uL (ref 4.22–5.81)
RDW: 14.6 % (ref 11.5–15.5)
WBC: 4.7 10*3/uL (ref 4.0–10.5)

## 2017-12-23 LAB — ETHANOL

## 2017-12-23 LAB — TROPONIN I

## 2017-12-23 NOTE — ED Notes (Signed)
Patient transported to MRI 

## 2017-12-23 NOTE — ED Provider Notes (Signed)
[facility]-EMERGENCY DEPT Provider Note   [name]: [id number] Arrival date & time: 12/23/17  1344     History   Chief Complaint Chief Complaint  Patient presents with  . Numbness    HPI Joshua Arocho. is a 47 y.o. male.  The history is provided by the patient. No language interpreter was used.   Joshua Bruns. is a 47 y.o. male who presents to the Emergency Department complaining of numbness. He presents to the emergency department upon referral from urgent care for evaluation of numbness to the left side of his tongue, left face and left upper extremity. Numbness began three days ago. When it first began he had bilateral associated headache. His numbness is described as a tingling and pressure type sensation. It is constant nature but does worsen at times. There are no clear alleviating or worsening factors. He sometimes describes the tingling and numbness is pain. He also reports one day of intermittent chest pressure. No clear alleviating or worsening factors. He denies any visual changes, changes in taste, shortness of breath, nausea, vomiting. He does feel slightly weak in his left upper extremity. He has a history of hypertension, prediabetes. He is a smoker and drinks alcohol daily. He denies any strict drug use. He has no history of TIA or CVA. He is right-hand dominant. Past Medical History:  Diagnosis Date  . Anxiety   . ANXIETY 06/03/2007  . Facial numbness 2004   ?small vessel disease; (-) hyper-coag profile  . GERD 06/03/2007  . GERD (gastroesophageal reflux disease)   . Hypertension   . HYPERTENSION 11/22/2006  . Migraine   . MIGRAINE HEADACHE 11/22/2006  . Rash 10/18/2010    Patient Active Problem List   Diagnosis Date Noted  . Bursitis, prepatellar 03/09/2011  . Rash 10/18/2010  . ANXIETY 06/03/2007  . GERD 06/03/2007  . MIGRAINE HEADACHE 11/22/2006  . Essential hypertension 11/22/2006    Past Surgical History:  Procedure Laterality  Date  . APPENDECTOMY          Home Medications    Prior to Admission medications   Medication Sig Start Date End Date Taking? Authorizing Provider  hydrochlorothiazide (HYDRODIURIL) 25 MG tablet TAKE 1 TABLET BY MOUTH EVERY DAY 11/07/16  Yes Sherren Mocha, MD  omeprazole (PRILOSEC) 40 MG capsule Take 40 mg by mouth daily with breakfast.    Yes [provider]  telmisartan (MICARDIS) 80 MG tablet Take 1 tablet (80 mg total) by mouth daily. Needs visit for any further refills. 10/10/16  Yes Sherren Mocha, MD  verapamil (CALAN-SR) 240 MG CR tablet TAKE 1 TABLET(240 MG) BY MOUTH AT BEDTIME Patient taking differently: TAKE 1 TABLET(240 MG) BY MOUTH daily in the morning. 11/07/16  Yes Sherren Mocha, MD    Family History Family History  Problem Relation Age of Onset  . Hypertension Father   . Diabetes Father   . Hypertension Mother     Social History Social History   Tobacco Use  . Smoking status: Current Every Day Smoker    Packs/day: 0.25    Types: Cigars  . Smokeless tobacco: Never Used  . Tobacco comment: 3 black and milds/day  Substance Use Topics  . Alcohol use: Yes    Alcohol/week: 0.0 oz    Comment: socially  . Drug use: No     Allergies   Patient has no known allergies.   Review of Systems Review of Systems  All other systems reviewed and are  negative.    Physical Exam Updated Vital Signs BP (!) 160/98 (BP Location: Right Arm)   Pulse 80   Temp 98.3 F (36.8 C) (Oral)   Resp 14   Ht 6\' 1"  (1.854 m)   Wt 98.4 kg (217 lb)   SpO2 96%   BMI 28.63 kg/m   Physical Exam  Constitutional: He is oriented to person, place, and time. He appears well-developed and well-nourished.  HENT:  Head: Normocephalic and atraumatic.  Eyes: Pupils are equal, round, and reactive to light. EOM are normal.  Cardiovascular: Normal rate and regular rhythm.  No murmur heard. Pulmonary/Chest: Effort normal and breath sounds normal. No respiratory distress.  Abdominal:  Soft. There is no tenderness. There is no rebound and no guarding.  Musculoskeletal: He exhibits no edema or tenderness.  Neurological: He is alert and oriented to person, place, and time.  Five out of five strength in all four extremities. There is altered sensation to light touch over the left lower face. No pronator drift.  Skin: Skin is warm and dry.  Psychiatric: He has a normal mood and affect. His behavior is normal.  Nursing note and vitals reviewed.    ED Treatments / Results  Labs (all labs ordered are listed, but only abnormal results are displayed) Labs Reviewed  URINALYSIS, ROUTINE W REFLEX MICROSCOPIC - Abnormal; Notable for the following components:      Result Value   Color, Urine STRAW (*)    All other components within normal limits  COMPREHENSIVE METABOLIC PANEL - Abnormal; Notable for the following components:   Glucose, Bld 128 (*)    All other components within normal limits  CBC WITH DIFFERENTIAL/PLATELET - Abnormal; Notable for the following components:   Hemoglobin 12.9 (*)    HCT 38.5 (*)    MCV 77.0 (*)    MCH 25.8 (*)    All other components within normal limits  ETHANOL  TROPONIN I    EKG EKG Interpretation  Date/Time:  Sunday December 23 2017 16:39:00 EDT Ventricular Rate:  75 PR Interval:    QRS Duration: 78 QT Interval:  354 QTC Calculation: 396 R Axis:   46 Text Interpretation:  Sinus rhythm LAE, consider biatrial enlargement ST elev, probable normal early repol pattern Baseline wander in lead(s) V2 Confirmed by Tilden Fossaees, Keyonia Gluth [phone]([id number]) on 12/23/2017 4:42:21 PM Also confirmed by Tilden Fossaees, Yoselin Amerman [phone]([id number]), editor Barbette Hairassel, Kerry [phone]([id number])  on 12/23/2017 5:04:12 PM   Radiology Ct Head Wo Contrast  Result Date: 12/23/2017 CLINICAL DATA:  Numbness tingling paresthesias of LEFT upper extremity and LEFT lower face for 3 days EXAM: CT HEAD WITHOUT CONTRAST TECHNIQUE: Contiguous axial images were obtained from the base of the skull through the vertex without  intravenous contrast. Sagittal and coronal MPR images reconstructed from axial data set. COMPARISON:  None FINDINGS: Brain: Normal ventricular morphology. No midline shift or mass effect. Normal appearance of brain parenchyma. No intracranial hemorrhage, mass lesion, evidence of acute infarction, or extra-axial fluid collection. Vascular: No hyperdense vessels Skull: Intact Sinuses/Orbits: Clear Other: N/A IMPRESSION: Normal exam. Electronically Signed   By: Ulyses SouthwardMark  Boles M.D.   On: 12/23/2017 16:52   Mr Brain Wo Contrast  Result Date: 12/23/2017 CLINICAL DATA:  47 y/o M; left-sided numbness head to toe for 3 days. EXAM: MRI HEAD WITHOUT CONTRAST TECHNIQUE: Multiplanar, multiecho pulse sequences of the brain and surrounding structures were obtained without intravenous contrast. COMPARISON:  12/23/2017 CT head. FINDINGS: Brain: No acute infarction, hemorrhage, hydrocephalus, extra-axial collection or mass lesion. No  significant structural or signal abnormality of the brain. Vascular: Normal flow voids. Skull and upper cervical spine: Normal marrow signal. Sinuses/Orbits: Negative. Other: None. IMPRESSION: No acute intracranial abnormality identified. Unremarkable MRI of the brain for age. Electronically Signed   By: Mitzi Hansen M.D.   On: 12/23/2017 21:37    Procedures Procedures (including critical care time)  Medications Ordered in ED Medications - No data to display   Initial Impression / Assessment and Plan / ED Course  I have reviewed the triage vital signs and the nursing notes.  Pertinent labs & imaging results that were available during my care of the patient were reviewed by me and considered in my medical decision making (see chart for details).     Patient here for evaluation of left facial and left upper extremity numbness for the last three days. He does have some altered sensation to light touch on exam but his strength is intact. Current presentation is not consistent  with ACS. Imaging not consistent with CVA. No evidence of hypertensive urgency. Discussed with patient home care, outpatient follow-up and return precautions for paresthesias.  Final Clinical Impressions(s) / ED Diagnoses   Final diagnoses:  Paresthesia    ED Discharge Orders    None       Tilden Fossa, MD 12/23/17 2253

## 2017-12-23 NOTE — ED Triage Notes (Signed)
Pt states numbness and pain on the left side of his mouth, as well as his arms. Pt has FROM, neuro intact. Pt states this started approximately 3 days ago. [facility]

## 2017-12-23 NOTE — ED Notes (Signed)
Patient transported to CT 

## 2017-12-23 NOTE — ED Notes (Signed)
Patient transported to CT. Will do ekg when pt returns to room. Pt has urinal at bedside and is aware of need for urine specimen.

## 2019-06-17 ENCOUNTER — Ambulatory Visit: Payer: PRIVATE HEALTH INSURANCE | Attending: Internal Medicine

## 2019-06-17 DIAGNOSIS — U071 COVID-19: Secondary | ICD-10-CM

## 2019-06-17 DIAGNOSIS — R238 Other skin changes: Secondary | ICD-10-CM

## 2019-06-18 LAB — NOVEL CORONAVIRUS, NAA: SARS-CoV-2, NAA: NOT DETECTED

## 2020-08-19 ENCOUNTER — Emergency Department (HOSPITAL_COMMUNITY): Payer: 59

## 2020-08-19 ENCOUNTER — Other Ambulatory Visit: Payer: Self-pay

## 2020-08-19 ENCOUNTER — Emergency Department (HOSPITAL_COMMUNITY)
Admission: EM | Admit: 2020-08-19 | Discharge: 2020-08-19 | Disposition: A | Discharge status: Home / Self Care | Payer: 59 | Attending: Emergency Medicine | Admitting: Emergency Medicine

## 2020-08-19 ENCOUNTER — Encounter (HOSPITAL_COMMUNITY): Payer: Self-pay

## 2020-08-19 DIAGNOSIS — R079 Chest pain, unspecified: Secondary | ICD-10-CM | POA: Diagnosis not present

## 2020-08-19 DIAGNOSIS — I10 Essential (primary) hypertension: Secondary | ICD-10-CM

## 2020-08-19 DIAGNOSIS — R030 Elevated blood-pressure reading, without diagnosis of hypertension: Secondary | ICD-10-CM

## 2020-08-19 DIAGNOSIS — F17290 Nicotine dependence, other tobacco product, uncomplicated: Secondary | ICD-10-CM | POA: Insufficient documentation

## 2020-08-19 DIAGNOSIS — Z79899 Other long term (current) drug therapy: Secondary | ICD-10-CM | POA: Diagnosis not present

## 2020-08-19 DIAGNOSIS — R0789 Other chest pain: Secondary | ICD-10-CM

## 2020-08-19 DIAGNOSIS — R072 Precordial pain: Secondary | ICD-10-CM

## 2020-08-19 DIAGNOSIS — R202 Paresthesia of skin: Secondary | ICD-10-CM | POA: Diagnosis not present

## 2020-08-19 LAB — COMPREHENSIVE METABOLIC PANEL
ALT: 26 U/L (ref 0–44)
AST: 22 U/L (ref 15–41)
Albumin: 4.6 g/dL (ref 3.5–5.0)
Alkaline Phosphatase: 49 U/L (ref 38–126)
Anion gap: 8 (ref 5–15)
BUN: 21 mg/dL — ABNORMAL HIGH (ref 6–20)
CO2: 26 mmol/L (ref 22–32)
Calcium: 9.2 mg/dL (ref 8.9–10.3)
Chloride: 102 mmol/L (ref 98–111)
Creatinine, Ser: 1.11 mg/dL (ref 0.61–1.24)
GFR, Estimated: >60 mL/min (ref >60)
Glucose, Bld: 123 mg/dL — ABNORMAL HIGH (ref 70–99)
Potassium: 4.1 mmol/L (ref 3.5–5.1)
Sodium: 136 mmol/L (ref 135–145)
Total Bilirubin: 0.7 mg/dL (ref 0.3–1.2)
Total Protein: 7.5 g/dL (ref 6.5–8.1)

## 2020-08-19 LAB — CBC
HCT: 41 % (ref 39.0–52.0)
Hemoglobin: 13.7 g/dL (ref 13.0–17.0)
MCH: 26.1 pg (ref 26.0–34.0)
MCHC: 33.4 g/dL (ref 30.0–36.0)
MCV: 78.1 fL — ABNORMAL LOW (ref 80.0–100.0)
Platelets: 291 10*3/uL (ref 150–400)
RBC: 5.25 MIL/uL (ref 4.22–5.81)
RDW: 14.4 % (ref 11.5–15.5)
WBC: 5.2 10*3/uL (ref 4.0–10.5)
nRBC: 0 % (ref 0.0–0.2)

## 2020-08-19 LAB — TROPONIN I (HIGH SENSITIVITY): Troponin I (High Sensitivity): 4 ng/L (ref <18)

## 2020-08-19 MED ORDER — VERAPAMIL HCL ER 240 MG PO TBCR
240.0000 mg | EXTENDED_RELEASE_TABLET | Freq: Once | ORAL | Status: AC
  Administered 2020-08-19: 240 mg via ORAL
  Filled 2020-08-19: qty 1

## 2020-08-19 MED ORDER — IRBESARTAN 300 MG PO TABS
300.0000 mg | ORAL_TABLET | Freq: Once | ORAL | Status: AC
  Administered 2020-08-19: 300 mg via ORAL
  Filled 2020-08-19: qty 1

## 2020-08-19 MED ORDER — HYDROCHLOROTHIAZIDE 25 MG PO TABS
25.0000 mg | ORAL_TABLET | Freq: Once | ORAL | Status: AC
  Administered 2020-08-19: 25 mg via ORAL
  Filled 2020-08-19: qty 1

## 2020-08-19 NOTE — Discharge Instructions (Addendum)
It was our pleasure to provide your ER care today - we hope that you feel better.  Your blood pressure is high today - continue your meds, limit salt intake, limit caffeine use, and follow up with primary care doctor in the coming week.  For earlier symptoms/chest discomfort, follow up with cardiologist in 1 week - call office to arrange appointment.   Return to ER right away if worse, new symptoms, recurrent or persistent chest pain, trouble breathing, one-side of body numbness/weakness, change in speech or vision, or other concern.

## 2020-08-19 NOTE — ED Provider Notes (Signed)
[facility]-EMERGENCY DEPT Provider Note   [name]: [id number] Arrival date & time: 08/19/20  0543     History Chief Complaint  Patient presents with  . Chest Pain  . Numbness    [name]. is a 50 y.o. male.  Patient indicates in past week, intermittent pain and tingling sensation to mouth, left chest, and occasionally left hand. Symptoms occur randomly/sporadically, at rest, mild, dull, lasting minutes to hours. No association with activity or exertion. No headache. No radicular pain. No constant or continually sense of any numbness or weakness. No pleuritic pain. No arm or leg swelling. No unusual doe or fatigue. Denies hx cad or fam hx premature cad. No hx cva.   The history is provided by the patient.  Chest Pain Associated symptoms: no abdominal pain, no back pain, no cough, no fever, no headache, no nausea, no palpitations, no shortness of breath and no vomiting        Past Medical History:  Diagnosis Date  . Anxiety   . ANXIETY 06/03/2007  . Facial numbness 2004   ?small vessel disease; (-) hyper-coag profile  . GERD 06/03/2007  . GERD (gastroesophageal reflux disease)   . Hypertension   . HYPERTENSION 11/22/2006  . Migraine   . MIGRAINE HEADACHE 11/22/2006  . Rash 10/18/2010    Patient Active Problem List   Diagnosis Date Noted  . Bursitis, prepatellar 03/09/2011  . Rash 10/18/2010  . ANXIETY 06/03/2007  . GERD 06/03/2007  . MIGRAINE HEADACHE 11/22/2006  . Essential hypertension 11/22/2006    Past Surgical History:  Procedure Laterality Date  . APPENDECTOMY         Family History  Problem Relation Age of Onset  . Hypertension Father   . Diabetes Father   . Hypertension Mother     Social History   Tobacco Use  . Smoking status: Current Every Day Smoker    Packs/day: 0.25    Types: Cigars  . Smokeless tobacco: Never Used  . Tobacco comment: 3 black and milds/day  Substance Use Topics  . Alcohol use: Yes     Alcohol/week: 0.0 standard drinks    Comment: socially  . Drug use: No    Home Medications Prior to Admission medications   Medication Sig Start Date End Date Taking? Authorizing Provider  hydrochlorothiazide (HYDRODIURIL) 25 MG tablet TAKE 1 TABLET BY MOUTH EVERY DAY 11/07/16   Sherren Mocha, MD  omeprazole (PRILOSEC) 40 MG capsule Take 40 mg by mouth daily with breakfast.     [provider]  telmisartan (MICARDIS) 80 MG tablet Take 1 tablet (80 mg total) by mouth daily. Needs visit for any further refills. 10/10/16   Sherren Mocha, MD  verapamil (CALAN-SR) 240 MG CR tablet TAKE 1 TABLET(240 MG) BY MOUTH AT BEDTIME Patient taking differently: TAKE 1 TABLET(240 MG) BY MOUTH daily in the morning. 11/07/16   Sherren Mocha, MD    Allergies    Patient has no known allergies.  Review of Systems   Review of Systems  Constitutional: Negative for fever.  HENT: Negative for sore throat.   Eyes: Negative for redness.  Respiratory: Negative for cough and shortness of breath.   Cardiovascular: Positive for chest pain. Negative for palpitations and leg swelling.  Gastrointestinal: Negative for abdominal pain, nausea and vomiting.  Genitourinary: Negative for flank pain.  Musculoskeletal: Negative for back pain and neck pain.  Skin: Negative for rash.  Neurological: Negative for headaches.  Hematological: Does not bruise/bleed  easily.  Psychiatric/Behavioral: Negative for confusion.    Physical Exam Updated Vital Signs BP (!) 137/103   Pulse 79   Temp 98.1 F (36.7 C)   Resp 16   SpO2 98%   Physical Exam Vitals and nursing note reviewed.  Constitutional:      Appearance: Normal appearance. He is well-developed.  HENT:     Head: Atraumatic.     Nose: Nose normal.     Mouth/Throat:     Mouth: Mucous membranes are moist.     Pharynx: Oropharynx is clear.  Eyes:     General: No scleral icterus.    Conjunctiva/sclera: Conjunctivae normal.  Neck:     Trachea: No tracheal  deviation.  Cardiovascular:     Rate and Rhythm: Normal rate and regular rhythm.     Pulses: Normal pulses.     Heart sounds: Normal heart sounds. No murmur heard. No friction rub. No gallop.   Pulmonary:     Effort: Pulmonary effort is normal. No accessory muscle usage or respiratory distress.     Breath sounds: Normal breath sounds.  Chest:     Chest wall: No tenderness.  Abdominal:     General: Bowel sounds are normal. There is no distension.     Palpations: Abdomen is soft.     Tenderness: There is no abdominal tenderness. There is no guarding.  Genitourinary:    Comments: No cva tenderness. Musculoskeletal:        General: No swelling or tenderness.     Cervical back: Normal range of motion and neck supple. No rigidity.     Right lower leg: No edema.     Left lower leg: No edema.  Skin:    General: Skin is warm and dry.     Findings: No rash.  Neurological:     Mental Status: He is alert.     Comments: Alert, speech clear. Motor/sens grossly intact bil. Steady gait.   Psychiatric:        Mood and Affect: Mood normal.     ED Results / Procedures / Treatments   Labs (all labs ordered are listed, but only abnormal results are displayed) Results for orders placed or performed during the hospital encounter of 08/19/20  CBC  Result Value Ref Range   WBC 5.2 4.0 - 10.5 K/uL   RBC 5.25 4.22 - 5.81 MIL/uL   Hemoglobin 13.7 13.0 - 17.0 g/dL   HCT 06.2 37.6 - 28.3 %   MCV 78.1 (L) 80.0 - 100.0 fL   MCH 26.1 26.0 - 34.0 pg   MCHC 33.4 30.0 - 36.0 g/dL   RDW 15.1 76.1 - 60.7 %   Platelets 291 150 - 400 K/uL   nRBC 0.0 0.0 - 0.2 %  Comprehensive metabolic panel  Result Value Ref Range   Sodium 136 135 - 145 mmol/L   Potassium 4.1 3.5 - 5.1 mmol/L   Chloride 102 98 - 111 mmol/L   CO2 26 22 - 32 mmol/L   Glucose, Bld 123 (H) 70 - 99 mg/dL   BUN 21 (H) 6 - 20 mg/dL   Creatinine, Ser 3.71 0.61 - 1.24 mg/dL   Calcium 9.2 8.9 - 06.2 mg/dL   Total Protein 7.5 6.5 - 8.1 g/dL    Albumin 4.6 3.5 - 5.0 g/dL   AST 22 15 - 41 U/L   ALT 26 0 - 44 U/L   Alkaline Phosphatase 49 38 - 126 U/L   Total Bilirubin 0.7 0.3 - 1.2  mg/dL   GFR, Estimated >84 >16 mL/min   Anion gap 8 5 - 15  Troponin I (High Sensitivity)  Result Value Ref Range   Troponin I (High Sensitivity) 4 <18 ng/L    EKG EKG Interpretation  Date/Time:  Thursday August 19 2020 09:06:51 EST Ventricular Rate:  74 PR Interval:    QRS Duration: 80 QT Interval:  363 QTC Calculation: 403 R Axis:   46 Text Interpretation: Sinus rhythm No significant change since last tracing Confirmed by Cathren Laine ([id number]) on 08/19/2020 9:31:03 AM   Radiology DG Chest Port 1 View  Result Date: 08/19/2020 CLINICAL DATA:  Chest pain EXAM: PORTABLE CHEST 1 VIEW COMPARISON:  None. FINDINGS: The heart size and mediastinal contours are within normal limits. Both lungs are clear. No pleural effusion or pneumothorax. The visualized skeletal structures are unremarkable. IMPRESSION: No acute process in the chest. Electronically Signed   By: Guadlupe Spanish M.D.   On: 08/19/2020 09:37    Procedures Procedures   Medications Ordered in ED Medications - No data to display  ED Course  I have reviewed the triage vital signs and the nursing notes.  Pertinent labs & imaging results that were available during my care of the patient were reviewed by me and considered in my medical decision making (see chart for details).    MDM Rules/Calculators/A&P                         Iv ns. Continuous pulse ox and cardiac monitoring. Stat labs. Ecg.   Reviewed nursing notes and prior charts for additional history.   Prior ED eval, and CT/MRI for similar symptoms in past, neg acute abnormality then.   Labs reviewed/interpreted by me - wbc normal. Hgb normal. After symptoms for past week - trop normal, felt not c/w ACS.   On exam, no focal neuro deficits.   Pts bp is high, hx same, has not had meds today - will give dose of his meds.    No current chest pain or discomfort. No sob or increased wob, rr 14, pulse ox 100% room air. No numbness/weakness or focal findings on neuro exam.      Final Clinical Impression(s) / ED Diagnoses Final diagnoses:  None    Rx / DC Orders ED Discharge Orders    None       Cathren Laine, MD 08/19/20 [phone]

## 2020-08-19 NOTE — ED Triage Notes (Signed)
Pt reports intermittent left sided numbness in head, neck, tongue, arm and leg for 1 week. Pt sts numbness and left sided chest pains got worse yesterday. Ambulatory.

## 2020-08-31 ENCOUNTER — Ambulatory Visit: Payer: 59 | Admitting: Internal Medicine

## 2020-09-22 ENCOUNTER — Encounter: Payer: Self-pay | Admitting: *Deleted

## 2021-08-24 ENCOUNTER — Ambulatory Visit: Payer: 59 | Admitting: Dietician

## 2021-10-12 ENCOUNTER — Ambulatory Visit: Payer: 59 | Admitting: Registered"

## 2021-10-13 ENCOUNTER — Ambulatory Visit: Payer: 59 | Admitting: Dietician

## 2021-12-12 ENCOUNTER — Ambulatory Visit: Payer: 59 | Admitting: Registered"

## 2024-02-29 ENCOUNTER — Other Ambulatory Visit (HOSPITAL_COMMUNITY): Payer: Self-pay | Admitting: Family Medicine

## 2024-02-29 DIAGNOSIS — R0602 Shortness of breath: Secondary | ICD-10-CM

## 2024-03-05 ENCOUNTER — Telehealth (HOSPITAL_COMMUNITY): Payer: Self-pay | Admitting: Emergency Medicine

## 2024-03-05 ENCOUNTER — Encounter (HOSPITAL_COMMUNITY): Payer: Self-pay

## 2024-03-05 MED ORDER — METOPROLOL TARTRATE 100 MG PO TABS: 100.0000 mg | ORAL_TABLET | Freq: Once | ORAL | 0 refills | Status: DC

## 2024-03-05 NOTE — Telephone Encounter (Signed)
Attempted to call patient regarding upcoming cardiac CT appointment. Left message on voicemail with name and callback number Marchia Bond RN Navigator Cardiac Imaging [facility] [phone] Office [phone] Cell  138m metoprolol tartrate sent to pharm on file

## 2024-03-06 ENCOUNTER — Ambulatory Visit (HOSPITAL_COMMUNITY): Admission: RE | Admit: 2024-03-06 | Source: Ambulatory Visit | Attending: Family Medicine | Admitting: Family Medicine

## 2024-03-14 ENCOUNTER — Telehealth (HOSPITAL_COMMUNITY): Payer: Self-pay | Admitting: *Deleted

## 2024-03-14 NOTE — Telephone Encounter (Signed)
 Reaching out to patient to offer assistance regarding upcoming cardiac imaging study; patient is out of town and request to reschedule. Appt rescheduled.  Sid Seats RN Navigator Cardiac Imaging [facility] [phone] office [phone] cell

## 2024-03-17 ENCOUNTER — Ambulatory Visit (HOSPITAL_COMMUNITY): Admission: RE | Admit: 2024-03-17 | Source: Ambulatory Visit | Attending: Family Medicine | Admitting: Family Medicine

## 2024-03-28 ENCOUNTER — Encounter (HOSPITAL_COMMUNITY): Payer: Self-pay

## 2024-03-31 ENCOUNTER — Encounter (HOSPITAL_COMMUNITY): Payer: Self-pay

## 2024-03-31 ENCOUNTER — Ambulatory Visit (HOSPITAL_COMMUNITY): Admission: RE | Admit: 2024-03-31 | Source: Ambulatory Visit

## 2024-04-08 ENCOUNTER — Telehealth (HOSPITAL_COMMUNITY): Payer: Self-pay | Admitting: *Deleted

## 2024-04-08 NOTE — Telephone Encounter (Signed)
 Attempted to call patient regarding upcoming cardiac CT appointment. Left message on voicemail with name and callback number  Larey Brick RN Navigator Cardiac Imaging [facility] [phone] Office [phone] Cell

## 2024-04-09 ENCOUNTER — Ambulatory Visit (HOSPITAL_COMMUNITY): Admission: RE | Admit: 2024-04-09 | Source: Ambulatory Visit

## 2024-04-25 ENCOUNTER — Encounter (HOSPITAL_COMMUNITY): Payer: Self-pay

## 2024-04-28 ENCOUNTER — Telehealth (HOSPITAL_COMMUNITY): Payer: Self-pay | Admitting: Emergency Medicine

## 2024-04-28 MED ORDER — METOPROLOL TARTRATE 100 MG PO TABS: 100.0000 mg | ORAL_TABLET | Freq: Once | ORAL | 0 refills | Status: DC

## 2024-04-28 NOTE — Telephone Encounter (Signed)
 Reaching out to patient to offer assistance regarding upcoming cardiac imaging study; pt verbalizes understanding of appt date/time, parking situation and where to check in, pre-test NPO status and medications ordered, and verified current allergies; name and call back number provided for further questions should they arise Rockwell Alexandria RN Navigator Cardiac Imaging [facility] [phone] office [phone] cell

## 2024-04-29 ENCOUNTER — Ambulatory Visit (HOSPITAL_COMMUNITY)
Admission: RE | Admit: 2024-04-29 | Discharge: 2024-04-29 | Disposition: A | Discharge status: Home / Self Care | Source: Ambulatory Visit | Attending: Cardiovascular Disease | Admitting: Cardiovascular Disease

## 2024-04-29 DIAGNOSIS — R0602 Shortness of breath: Secondary | ICD-10-CM | POA: Diagnosis present

## 2024-04-29 MED ORDER — IOHEXOL 350 MG/ML SOLN
100.0000 mL | Freq: Once | INTRAVENOUS | Status: AC | PRN
  Administered 2024-04-29: 100 mL via INTRAVENOUS

## 2024-04-29 MED ORDER — NITROGLYCERIN 0.4 MG SL SUBL
0.8000 mg | SUBLINGUAL_TABLET | Freq: Once | SUBLINGUAL | Status: AC
  Administered 2024-04-29: 0.8 mg via SUBLINGUAL

## 2024-05-07 ENCOUNTER — Emergency Department (HOSPITAL_BASED_OUTPATIENT_CLINIC_OR_DEPARTMENT_OTHER)
Admission: EM | Admit: 2024-05-07 | Discharge: 2024-05-07 | Disposition: A | Discharge status: Home / Self Care | Attending: Emergency Medicine | Admitting: Emergency Medicine

## 2024-05-07 ENCOUNTER — Other Ambulatory Visit: Payer: Self-pay

## 2024-05-07 DIAGNOSIS — I10 Essential (primary) hypertension: Secondary | ICD-10-CM | POA: Diagnosis present

## 2024-05-07 DIAGNOSIS — Z7982 Long term (current) use of aspirin: Secondary | ICD-10-CM | POA: Diagnosis not present

## 2024-05-07 DIAGNOSIS — Z79899 Other long term (current) drug therapy: Secondary | ICD-10-CM | POA: Diagnosis not present

## 2024-05-07 LAB — BASIC METABOLIC PANEL WITH GFR
Anion gap: 13 (ref 5–15)
BUN: 16 mg/dL (ref 6–20)
CO2: 24 mmol/L (ref 22–32)
Calcium: 9.5 mg/dL (ref 8.9–10.3)
Chloride: 100 mmol/L (ref 98–111)
Creatinine, Ser: 1.18 mg/dL (ref 0.61–1.24)
GFR, Estimated: >60 mL/min (ref >60)
Glucose, Bld: 116 mg/dL — ABNORMAL HIGH (ref 70–99)
Potassium: 3.7 mmol/L (ref 3.5–5.1)
Sodium: 137 mmol/L (ref 135–145)

## 2024-05-07 NOTE — ED Triage Notes (Signed)
 Pt POV reporting blurred vision this AM and BP 198/100 just PTA. Recently began new BP med last week. Denies sx at this time.

## 2024-05-07 NOTE — ED Provider Notes (Signed)
 [place] EMERGENCY DEPARTMENT AT [facility] Provider Note   [name]: [id number] Arrival date & time: 05/07/24  0034     Patient presents with: Hypertension   [name]. is a 53 y.o. male.   The history is provided by the patient.  Patient with history of hypertension presents with elevated blood pressure.  Patient reports he has had hypertension for years and is compliant with medications.  He recently established with a local PCP, and due to elevated blood pressure, his verapamil  was increased Since that time he has noticed blood pressure has remained high.  He was advised that if he had any symptoms such as blurred vision or chest pain he should go to the ER.  Earlier today he noted a brief episode of blurred vision but no visual loss, no diplopia.  Tonight he woke up and felt his heart pounding and checked his blood pressure and it was elevated.  He denies any chest pain or shortness of breath.  No arm or leg weakness He denies any headache He denies any visual changes at this time.  He reports daily compliance with his medications He has follow-up next week with his PCP     Prior to Admission medications   Medication Sig Start Date End Date Taking? Authorizing Provider  aspirin EC 81 MG tablet Take 81 mg by mouth every 4 (four) hours as needed for mild pain or fever. Swallow whole.    [provider]  hydrochlorothiazide  (HYDRODIURIL ) 25 MG tablet TAKE 1 TABLET BY MOUTH EVERY DAY Patient taking differently: Take 25 mg by mouth daily. 11/07/16   Loreli Elyn SAILOR, MD  omeprazole (PRILOSEC) 20 MG capsule Take 20 mg by mouth daily. 06/17/20   [provider]  polyvinyl alcohol (LIQUIFILM TEARS) 1.4 % ophthalmic solution Place 1 drop into both eyes as needed for dry eyes.    [provider]  telmisartan  (MICARDIS ) 80 MG tablet Take 1 tablet (80 mg total) by mouth daily. Needs visit for any further refills. 10/10/16   Loreli Elyn SAILOR, MD  verapamil   (VERELAN  PM) 240 MG 24 hr capsule Take 240 mg by mouth daily. 07/07/20   [provider]    Allergies: Patient has no known allergies.    Review of Systems  Constitutional:  Negative for fever.  Eyes:  Positive for visual disturbance.  Cardiovascular:  Positive for palpitations.  Gastrointestinal:  Negative for vomiting.  Neurological:  Negative for speech difficulty, weakness and numbness.    Updated Vital Signs BP (!) 155/97   Pulse 84   Temp 98.5 F (36.9 C) (Oral)   Resp 17   Ht 1.867 m (6' 1.5)   Wt 103.4 kg   SpO2 96%   BMI 29.67 kg/m   Physical Exam CONSTITUTIONAL: Well developed/well nourished HEAD: Normocephalic/atraumatic EYES: EOMI/PERRL, no nystagmus, no visual field deficit  no ptosis ENMT: Mucous membranes moist NECK: supple no meningeal signs, no bruits CV: S1/S2 noted, no murmurs/rubs/gallops noted LUNGS: Lungs are clear to auscultation bilaterally, no apparent distress ABDOMEN: soft, nontender NEURO:Awake/alert, face symmetric, no arm or leg drift is noted Equal 5/5 strength with shoulder abduction, elbow flex/extension, wrist flex/extension in upper extremities and equal hand grips bilaterally Equal 5/5 strength with hip flexion,knee flex/extension, foot dorsi/plantar flexion Cranial nerves 3/4/5/6/12/25/08/11/12 tested and intact Gait normal without ataxia No past pointing Sensation to light touch intact in all extremities EXTREMITIES: pulses normal, full ROM SKIN: warm, color normal PSYCH: no abnormalities of mood noted  (all  labs ordered are listed, but only abnormal results are displayed) Labs Reviewed  BASIC METABOLIC PANEL WITH GFR - Abnormal; Notable for the following components:      Result Value   Glucose, Bld 116 (*)    All other components within normal limits    EKG: None  Radiology: No results found.   Procedures   Medications Ordered in the ED - No data to display  Clinical Course as of 05/07/24 0315  Wed May 07, 2024  0233 Patient is having difficulty manage his blood pressure despite taking 3 medications.  He is on verapamil , telmisartan  and hydrochlorothiazide  He is currently without symptoms.  His blood pressure and has slowly improved  Patient has had no recent labs.  Will check electrolytes and renal function. No indication for imaging at this time [DW]  0315 Labs overall reassuring.  Patient will continue his BP meds at home, and follow-up with PCP next week [DW]    Clinical Course User Index [DW] Midge Golas, MD                                 Medical Decision Making Amount and/or Complexity of Data Reviewed Labs: ordered.        Final diagnoses:  Primary hypertension    ED Discharge Orders     None          Midge Golas, MD 05/07/24 [phone]

## 2024-05-07 NOTE — Discharge Instructions (Addendum)
 Your sodium, potassium and kidney function were all at baseline Continue take your medicines as prescribed and monitor your blood pressure at home.  Please follow-up with your primary provider on November 25
